# Patient Record
Sex: Male | Born: 1955
Health system: Southern US, Community
[De-identification: ages and names within clinical notes are randomized; demographics above are authoritative.]

## PROBLEM LIST (undated history)

## (undated) DIAGNOSIS — F329 Major depressive disorder, single episode, unspecified: Secondary | ICD-10-CM

## (undated) DIAGNOSIS — B0052 Herpesviral keratitis: Secondary | ICD-10-CM

## (undated) DIAGNOSIS — I5030 Unspecified diastolic (congestive) heart failure: Secondary | ICD-10-CM

## (undated) DIAGNOSIS — I10 Essential (primary) hypertension: Secondary | ICD-10-CM

## (undated) DIAGNOSIS — B20 Human immunodeficiency virus [HIV] disease: Secondary | ICD-10-CM

## (undated) DIAGNOSIS — Z21 Asymptomatic human immunodeficiency virus [HIV] infection status: Secondary | ICD-10-CM

## (undated) DIAGNOSIS — D893 Immune reconstitution syndrome: Secondary | ICD-10-CM

## (undated) DIAGNOSIS — F32A Depression, unspecified: Secondary | ICD-10-CM

## (undated) HISTORY — DX: Herpesviral keratitis: B00.52

## (undated) HISTORY — DX: Immune reconstitution syndrome: D89.3

## (undated) HISTORY — DX: Asymptomatic human immunodeficiency virus (hiv) infection status: Z21

## (undated) HISTORY — DX: Major depressive disorder, single episode, unspecified: F32.9

## (undated) HISTORY — DX: Depression, unspecified: F32.A

## (undated) HISTORY — DX: Unspecified diastolic (congestive) heart failure: I50.30

## (undated) HISTORY — DX: Essential (primary) hypertension: I10

## (undated) HISTORY — DX: Human immunodeficiency virus (HIV) disease: B20

---

## 2001-09-15 ENCOUNTER — Inpatient Hospital Stay (HOSPITAL_COMMUNITY): Admission: EM | Admit: 2001-09-15 | Discharge: 2001-09-17 | Payer: Self-pay | Admitting: Emergency Medicine

## 2001-09-15 ENCOUNTER — Encounter: Payer: Self-pay | Admitting: Emergency Medicine

## 2001-09-22 ENCOUNTER — Encounter: Admission: RE | Admit: 2001-09-22 | Discharge: 2001-09-22 | Payer: Self-pay | Admitting: Internal Medicine

## 2001-10-20 ENCOUNTER — Ambulatory Visit (HOSPITAL_COMMUNITY): Admission: RE | Admit: 2001-10-20 | Discharge: 2001-10-20 | Payer: Self-pay | Admitting: Internal Medicine

## 2001-10-20 ENCOUNTER — Encounter: Admission: RE | Admit: 2001-10-20 | Discharge: 2001-10-20 | Payer: Self-pay | Admitting: Internal Medicine

## 2001-10-20 ENCOUNTER — Encounter (INDEPENDENT_AMBULATORY_CARE_PROVIDER_SITE_OTHER): Payer: Self-pay | Admitting: *Deleted

## 2001-10-20 LAB — CONVERTED CEMR LAB: CD4 Count: 9 microliters

## 2001-11-04 ENCOUNTER — Ambulatory Visit (HOSPITAL_COMMUNITY): Admission: RE | Admit: 2001-11-04 | Discharge: 2001-11-04 | Payer: Self-pay | Admitting: Infectious Diseases

## 2001-11-04 ENCOUNTER — Encounter: Admission: RE | Admit: 2001-11-04 | Discharge: 2001-11-04 | Payer: Self-pay | Admitting: Infectious Diseases

## 2001-11-24 ENCOUNTER — Encounter: Admission: RE | Admit: 2001-11-24 | Discharge: 2001-11-24 | Payer: Self-pay | Admitting: Infectious Diseases

## 2002-01-05 ENCOUNTER — Ambulatory Visit (HOSPITAL_COMMUNITY): Admission: RE | Admit: 2002-01-05 | Discharge: 2002-01-05 | Payer: Self-pay | Admitting: Infectious Diseases

## 2002-01-05 ENCOUNTER — Encounter: Admission: RE | Admit: 2002-01-05 | Discharge: 2002-01-05 | Payer: Self-pay | Admitting: Infectious Diseases

## 2002-02-09 ENCOUNTER — Encounter: Admission: RE | Admit: 2002-02-09 | Discharge: 2002-02-09 | Payer: Self-pay | Admitting: Infectious Diseases

## 2002-05-04 ENCOUNTER — Encounter: Admission: RE | Admit: 2002-05-04 | Discharge: 2002-05-04 | Payer: Self-pay | Admitting: Infectious Diseases

## 2002-10-05 ENCOUNTER — Ambulatory Visit (HOSPITAL_COMMUNITY): Admission: RE | Admit: 2002-10-05 | Discharge: 2002-10-05 | Payer: Self-pay | Admitting: Infectious Diseases

## 2002-10-05 ENCOUNTER — Encounter: Payer: Self-pay | Admitting: Infectious Diseases

## 2002-10-05 ENCOUNTER — Encounter: Admission: RE | Admit: 2002-10-05 | Discharge: 2002-10-05 | Payer: Self-pay | Admitting: Infectious Diseases

## 2003-01-23 ENCOUNTER — Encounter: Admission: RE | Admit: 2003-01-23 | Discharge: 2003-01-23 | Payer: Self-pay | Admitting: Infectious Diseases

## 2003-01-23 ENCOUNTER — Ambulatory Visit (HOSPITAL_COMMUNITY): Admission: RE | Admit: 2003-01-23 | Discharge: 2003-01-23 | Payer: Self-pay | Admitting: Infectious Diseases

## 2003-01-23 ENCOUNTER — Encounter: Payer: Self-pay | Admitting: Infectious Diseases

## 2003-07-19 ENCOUNTER — Encounter: Admission: RE | Admit: 2003-07-19 | Discharge: 2003-07-19 | Payer: Self-pay | Admitting: Infectious Diseases

## 2003-07-19 ENCOUNTER — Ambulatory Visit (HOSPITAL_COMMUNITY): Admission: RE | Admit: 2003-07-19 | Discharge: 2003-07-19 | Payer: Self-pay | Admitting: Infectious Diseases

## 2003-08-02 ENCOUNTER — Encounter: Admission: RE | Admit: 2003-08-02 | Discharge: 2003-08-02 | Payer: Self-pay | Admitting: Infectious Diseases

## 2003-09-06 ENCOUNTER — Encounter: Admission: RE | Admit: 2003-09-06 | Discharge: 2003-09-06 | Payer: Self-pay | Admitting: Internal Medicine

## 2003-11-13 ENCOUNTER — Encounter: Admission: RE | Admit: 2003-11-13 | Discharge: 2003-11-13 | Payer: Self-pay | Admitting: Infectious Diseases

## 2003-11-13 ENCOUNTER — Ambulatory Visit (HOSPITAL_COMMUNITY): Admission: RE | Admit: 2003-11-13 | Discharge: 2003-11-13 | Payer: Self-pay | Admitting: Infectious Diseases

## 2003-12-25 ENCOUNTER — Ambulatory Visit: Payer: Self-pay | Admitting: Infectious Diseases

## 2004-05-01 ENCOUNTER — Ambulatory Visit (HOSPITAL_COMMUNITY): Admission: RE | Admit: 2004-05-01 | Discharge: 2004-05-01 | Payer: Self-pay | Admitting: Infectious Diseases

## 2004-05-01 ENCOUNTER — Ambulatory Visit: Payer: Self-pay | Admitting: Infectious Diseases

## 2004-07-03 ENCOUNTER — Ambulatory Visit (HOSPITAL_COMMUNITY): Admission: RE | Admit: 2004-07-03 | Discharge: 2004-07-03 | Payer: Self-pay | Admitting: Infectious Diseases

## 2004-07-03 ENCOUNTER — Ambulatory Visit: Payer: Self-pay | Admitting: Infectious Diseases

## 2004-10-26 ENCOUNTER — Emergency Department (HOSPITAL_COMMUNITY): Admission: EM | Admit: 2004-10-26 | Discharge: 2004-10-26 | Payer: Self-pay | Admitting: Emergency Medicine

## 2004-11-01 ENCOUNTER — Ambulatory Visit: Payer: Self-pay | Admitting: Infectious Diseases

## 2004-11-20 ENCOUNTER — Ambulatory Visit: Payer: Self-pay | Admitting: Infectious Diseases

## 2004-12-06 ENCOUNTER — Ambulatory Visit: Payer: Self-pay | Admitting: Infectious Diseases

## 2005-01-17 ENCOUNTER — Ambulatory Visit (HOSPITAL_COMMUNITY): Admission: RE | Admit: 2005-01-17 | Discharge: 2005-01-17 | Payer: Self-pay | Admitting: Infectious Diseases

## 2005-01-17 ENCOUNTER — Ambulatory Visit: Payer: Self-pay | Admitting: Infectious Diseases

## 2005-03-26 ENCOUNTER — Ambulatory Visit: Payer: Self-pay | Admitting: Infectious Diseases

## 2005-03-26 ENCOUNTER — Encounter (INDEPENDENT_AMBULATORY_CARE_PROVIDER_SITE_OTHER): Payer: Self-pay | Admitting: *Deleted

## 2005-03-26 ENCOUNTER — Ambulatory Visit (HOSPITAL_COMMUNITY): Admission: RE | Admit: 2005-03-26 | Discharge: 2005-03-26 | Payer: Self-pay | Admitting: Infectious Diseases

## 2005-03-26 LAB — CONVERTED CEMR LAB: HIV 1 RNA Quant: 399 copies/mL

## 2005-08-20 ENCOUNTER — Ambulatory Visit: Payer: Self-pay | Admitting: Infectious Diseases

## 2005-08-20 ENCOUNTER — Encounter (INDEPENDENT_AMBULATORY_CARE_PROVIDER_SITE_OTHER): Payer: Self-pay | Admitting: *Deleted

## 2005-08-20 ENCOUNTER — Encounter: Admission: RE | Admit: 2005-08-20 | Discharge: 2005-08-20 | Payer: Self-pay | Admitting: Infectious Diseases

## 2005-08-20 LAB — CONVERTED CEMR LAB
CD4 Count: 60 microliters
HIV 1 RNA Quant: 1000000 copies/mL

## 2005-10-01 ENCOUNTER — Ambulatory Visit: Payer: Self-pay | Admitting: Internal Medicine

## 2005-12-05 ENCOUNTER — Ambulatory Visit: Payer: Self-pay | Admitting: Internal Medicine

## 2005-12-22 ENCOUNTER — Encounter: Admission: RE | Admit: 2005-12-22 | Discharge: 2005-12-22 | Payer: Self-pay | Admitting: Infectious Diseases

## 2005-12-22 ENCOUNTER — Ambulatory Visit: Payer: Self-pay | Admitting: Infectious Diseases

## 2005-12-22 ENCOUNTER — Encounter (INDEPENDENT_AMBULATORY_CARE_PROVIDER_SITE_OTHER): Payer: Self-pay | Admitting: *Deleted

## 2005-12-22 LAB — CONVERTED CEMR LAB: HIV 1 RNA Quant: 119000 copies/mL — ABNORMAL HIGH (ref <50)

## 2005-12-24 DIAGNOSIS — F528 Other sexual dysfunction not due to a substance or known physiological condition: Secondary | ICD-10-CM

## 2005-12-24 DIAGNOSIS — H05019 Cellulitis of unspecified orbit: Secondary | ICD-10-CM

## 2005-12-24 DIAGNOSIS — L0293 Carbuncle, unspecified: Secondary | ICD-10-CM

## 2005-12-24 DIAGNOSIS — B20 Human immunodeficiency virus [HIV] disease: Secondary | ICD-10-CM

## 2005-12-24 DIAGNOSIS — G4700 Insomnia, unspecified: Secondary | ICD-10-CM

## 2005-12-24 DIAGNOSIS — L0292 Furuncle, unspecified: Secondary | ICD-10-CM | POA: Insufficient documentation

## 2005-12-24 DIAGNOSIS — L0390 Cellulitis, unspecified: Secondary | ICD-10-CM

## 2005-12-24 DIAGNOSIS — L0291 Cutaneous abscess, unspecified: Secondary | ICD-10-CM

## 2005-12-24 DIAGNOSIS — B59 Pneumocystosis: Secondary | ICD-10-CM | POA: Insufficient documentation

## 2006-05-06 ENCOUNTER — Encounter: Payer: Self-pay | Admitting: Infectious Diseases

## 2006-05-06 ENCOUNTER — Encounter: Admission: RE | Admit: 2006-05-06 | Discharge: 2006-05-06 | Payer: Self-pay | Admitting: Infectious Diseases

## 2006-05-06 ENCOUNTER — Ambulatory Visit: Payer: Self-pay | Admitting: Infectious Diseases

## 2006-05-06 DIAGNOSIS — M129 Arthropathy, unspecified: Secondary | ICD-10-CM | POA: Insufficient documentation

## 2006-05-06 LAB — CONVERTED CEMR LAB
ALT: 16 units/L (ref 0–53)
Alkaline Phosphatase: 93 units/L (ref 39–117)
CO2: 22 meq/L (ref 19–32)
Cholesterol: 226 mg/dL — ABNORMAL HIGH (ref 0–200)
Creatinine, Ser: 1.04 mg/dL (ref 0.40–1.50)
HCT: 46.3 % (ref 39.0–52.0)
HIV 1 RNA Quant: 105 copies/mL — ABNORMAL HIGH (ref <50)
LDL Cholesterol: 137 mg/dL — ABNORMAL HIGH (ref 0–99)
MCHC: 32.2 g/dL (ref 30.0–36.0)
MCV: 93.3 fL (ref 78.0–100.0)
Platelets: 302 10*3/uL (ref 150–400)
Sodium: 137 meq/L (ref 135–145)
Total Bilirubin: 0.5 mg/dL (ref 0.3–1.2)
Total CHOL/HDL Ratio: 3.5
Triglycerides: 121 mg/dL (ref <150)
VLDL: 24 mg/dL (ref 0–40)
WBC: 4.3 10*3/uL (ref 4.0–10.5)

## 2006-05-11 ENCOUNTER — Encounter (INDEPENDENT_AMBULATORY_CARE_PROVIDER_SITE_OTHER): Payer: Self-pay | Admitting: *Deleted

## 2006-05-11 LAB — CONVERTED CEMR LAB

## 2006-05-15 ENCOUNTER — Encounter: Payer: Self-pay | Admitting: Infectious Diseases

## 2006-05-24 ENCOUNTER — Encounter (INDEPENDENT_AMBULATORY_CARE_PROVIDER_SITE_OTHER): Payer: Self-pay | Admitting: *Deleted

## 2006-06-08 ENCOUNTER — Telehealth: Payer: Self-pay | Admitting: Infectious Diseases

## 2006-07-24 ENCOUNTER — Telehealth: Payer: Self-pay | Admitting: Infectious Diseases

## 2007-12-14 ENCOUNTER — Telehealth (INDEPENDENT_AMBULATORY_CARE_PROVIDER_SITE_OTHER): Payer: Self-pay | Admitting: *Deleted

## 2007-12-15 ENCOUNTER — Ambulatory Visit: Payer: Self-pay | Admitting: Internal Medicine

## 2007-12-15 ENCOUNTER — Ambulatory Visit (HOSPITAL_COMMUNITY): Admission: RE | Admit: 2007-12-15 | Discharge: 2007-12-15 | Payer: Self-pay | Admitting: Internal Medicine

## 2007-12-15 DIAGNOSIS — J8410 Pulmonary fibrosis, unspecified: Secondary | ICD-10-CM | POA: Insufficient documentation

## 2007-12-15 LAB — CONVERTED CEMR LAB
AST: 30 units/L (ref 0–37)
Alkaline Phosphatase: 87 units/L (ref 39–117)
BUN: 10 mg/dL (ref 6–23)
Basophils Relative: 0 % (ref 0–1)
Calcium: 8.4 mg/dL (ref 8.4–10.5)
Chloride: 106 meq/L (ref 96–112)
Creatinine, Ser: 1.13 mg/dL (ref 0.40–1.50)
Eosinophils Absolute: 0.5 10*3/uL (ref 0.0–0.7)
Eosinophils Relative: 14 % — ABNORMAL HIGH (ref 0–5)
Hemoglobin: 10 g/dL — ABNORMAL LOW (ref 13.0–17.0)
MCHC: 31.1 g/dL (ref 30.0–36.0)
MCV: 76.5 fL — ABNORMAL LOW (ref 78.0–100.0)
Monocytes Absolute: 0.3 10*3/uL (ref 0.1–1.0)
Monocytes Relative: 7 % (ref 3–12)
RBC: 4.21 M/uL — ABNORMAL LOW (ref 4.22–5.81)
Total Bilirubin: 0.3 mg/dL (ref 0.3–1.2)

## 2007-12-20 ENCOUNTER — Telehealth: Payer: Self-pay

## 2008-01-18 ENCOUNTER — Ambulatory Visit: Payer: Self-pay | Admitting: Infectious Diseases

## 2008-01-18 LAB — CONVERTED CEMR LAB: Hep A Total Ab: NEGATIVE

## 2008-02-23 ENCOUNTER — Emergency Department (HOSPITAL_COMMUNITY): Admission: EM | Admit: 2008-02-23 | Discharge: 2008-02-23 | Payer: Self-pay | Admitting: Emergency Medicine

## 2008-03-22 ENCOUNTER — Ambulatory Visit: Payer: Self-pay | Admitting: Infectious Diseases

## 2008-03-22 LAB — CONVERTED CEMR LAB: HIV 1 RNA Quant: 76200 copies/mL — ABNORMAL HIGH (ref <48)

## 2008-03-24 LAB — CONVERTED CEMR LAB
Albumin: 4.2 g/dL (ref 3.5–5.2)
CO2: 21 meq/L (ref 19–32)
Calcium: 8.9 mg/dL (ref 8.4–10.5)
Eosinophils Relative: 23 % — ABNORMAL HIGH (ref 0–5)
Glucose, Bld: 86 mg/dL (ref 70–99)
HCT: 31.3 % — ABNORMAL LOW (ref 39.0–52.0)
Lymphocytes Relative: 18 % (ref 12–46)
Lymphs Abs: 1 10*3/uL (ref 0.7–4.0)
Platelets: 304 10*3/uL (ref 150–400)
Potassium: 4.3 meq/L (ref 3.5–5.3)
Sodium: 136 meq/L (ref 135–145)
Total Bilirubin: 0.4 mg/dL (ref 0.3–1.2)
Total Protein: 8.4 g/dL — ABNORMAL HIGH (ref 6.0–8.3)
WBC: 5.4 10*3/uL (ref 4.0–10.5)

## 2008-04-25 ENCOUNTER — Ambulatory Visit: Payer: Self-pay | Admitting: Gastroenterology

## 2008-04-25 DIAGNOSIS — D509 Iron deficiency anemia, unspecified: Secondary | ICD-10-CM | POA: Insufficient documentation

## 2008-04-25 DIAGNOSIS — R12 Heartburn: Secondary | ICD-10-CM | POA: Insufficient documentation

## 2008-05-05 ENCOUNTER — Telehealth: Payer: Self-pay | Admitting: Gastroenterology

## 2008-05-15 ENCOUNTER — Telehealth: Payer: Self-pay | Admitting: Gastroenterology

## 2008-05-22 ENCOUNTER — Ambulatory Visit: Payer: Self-pay | Admitting: Gastroenterology

## 2008-10-17 ENCOUNTER — Telehealth: Payer: Self-pay | Admitting: Gastroenterology

## 2008-11-23 ENCOUNTER — Encounter: Payer: Self-pay | Admitting: Gastroenterology

## 2008-11-23 DIAGNOSIS — A4902 Methicillin resistant Staphylococcus aureus infection, unspecified site: Secondary | ICD-10-CM | POA: Insufficient documentation

## 2008-11-28 ENCOUNTER — Telehealth: Payer: Self-pay | Admitting: Gastroenterology

## 2008-11-28 ENCOUNTER — Ambulatory Visit: Payer: Self-pay | Admitting: Gastroenterology

## 2008-11-29 ENCOUNTER — Ambulatory Visit: Payer: Self-pay | Admitting: Infectious Diseases

## 2008-11-29 LAB — CONVERTED CEMR LAB
ALT: 13 units/L (ref 0–53)
Alkaline Phosphatase: 85 units/L (ref 39–117)
Basophils Absolute: 0 10*3/uL (ref 0.0–0.1)
Basophils Relative: 0 % (ref 0–1)
Creatinine, Ser: 0.89 mg/dL (ref 0.40–1.50)
Glucose, Bld: 93 mg/dL (ref 70–99)
HIV 1 RNA Quant: 57700 copies/mL — ABNORMAL HIGH (ref <48)
MCHC: 28.7 g/dL — ABNORMAL LOW (ref 30.0–36.0)
Neutro Abs: 3 10*3/uL (ref 1.7–7.7)
Neutrophils Relative %: 60 % (ref 43–77)
Platelets: 369 10*3/uL (ref 150–400)
RDW: 19.8 % — ABNORMAL HIGH (ref 11.5–15.5)
Sodium: 137 meq/L (ref 135–145)
Total Bilirubin: 0.4 mg/dL (ref 0.3–1.2)
Total Protein: 8.5 g/dL — ABNORMAL HIGH (ref 6.0–8.3)

## 2008-12-07 ENCOUNTER — Telehealth (INDEPENDENT_AMBULATORY_CARE_PROVIDER_SITE_OTHER): Payer: Self-pay | Admitting: *Deleted

## 2009-01-08 ENCOUNTER — Inpatient Hospital Stay (HOSPITAL_COMMUNITY): Admission: EM | Admit: 2009-01-08 | Discharge: 2009-01-12 | Payer: Self-pay | Admitting: Emergency Medicine

## 2009-01-08 ENCOUNTER — Ambulatory Visit: Payer: Self-pay | Admitting: Infectious Diseases

## 2009-01-12 ENCOUNTER — Encounter: Payer: Self-pay | Admitting: Infectious Diseases

## 2009-01-12 ENCOUNTER — Encounter: Payer: Self-pay | Admitting: Internal Medicine

## 2009-01-24 ENCOUNTER — Ambulatory Visit: Payer: Self-pay | Admitting: Infectious Diseases

## 2009-01-24 DIAGNOSIS — K1370 Unspecified lesions of oral mucosa: Secondary | ICD-10-CM | POA: Insufficient documentation

## 2009-01-24 LAB — CONVERTED CEMR LAB
Basophils Relative: 1 % (ref 0–1)
HIV 1 RNA Quant: 249000 copies/mL — ABNORMAL HIGH (ref <48)
HIV-1 RNA Quant, Log: 5.4 — ABNORMAL HIGH (ref <1.68)
Hemoglobin: 8.4 g/dL — ABNORMAL LOW (ref 13.0–17.0)
Lymphs Abs: 0.7 10*3/uL (ref 0.7–4.0)
MCHC: 27.6 g/dL — ABNORMAL LOW (ref 30.0–36.0)
Monocytes Absolute: 0.2 10*3/uL (ref 0.1–1.0)
Monocytes Relative: 6 % (ref 3–12)
Neutro Abs: 1.8 10*3/uL (ref 1.7–7.7)
RBC: 4.59 M/uL (ref 4.22–5.81)
Retic Ct Pct: 1.1 % (ref 0.4–3.1)
WBC: 3.1 10*3/uL — ABNORMAL LOW (ref 4.0–10.5)

## 2009-01-31 ENCOUNTER — Encounter (INDEPENDENT_AMBULATORY_CARE_PROVIDER_SITE_OTHER): Payer: Self-pay | Admitting: *Deleted

## 2009-02-16 ENCOUNTER — Telehealth: Payer: Self-pay | Admitting: Infectious Diseases

## 2009-02-19 ENCOUNTER — Telehealth (INDEPENDENT_AMBULATORY_CARE_PROVIDER_SITE_OTHER): Payer: Self-pay | Admitting: *Deleted

## 2009-02-21 ENCOUNTER — Ambulatory Visit: Payer: Self-pay | Admitting: Infectious Diseases

## 2009-02-21 DIAGNOSIS — A310 Pulmonary mycobacterial infection: Secondary | ICD-10-CM | POA: Insufficient documentation

## 2009-02-22 ENCOUNTER — Telehealth (INDEPENDENT_AMBULATORY_CARE_PROVIDER_SITE_OTHER): Payer: Self-pay | Admitting: *Deleted

## 2009-03-21 ENCOUNTER — Telehealth (INDEPENDENT_AMBULATORY_CARE_PROVIDER_SITE_OTHER): Payer: Self-pay | Admitting: *Deleted

## 2009-04-11 ENCOUNTER — Ambulatory Visit: Payer: Self-pay | Admitting: Infectious Diseases

## 2009-04-11 DIAGNOSIS — H109 Unspecified conjunctivitis: Secondary | ICD-10-CM | POA: Insufficient documentation

## 2009-04-11 LAB — CONVERTED CEMR LAB
ALT: 16 units/L (ref 0–53)
AST: 17 units/L (ref 0–37)
CO2: 20 meq/L (ref 19–32)
Calcium: 9.6 mg/dL (ref 8.4–10.5)
Chloride: 106 meq/L (ref 96–112)
Cholesterol: 158 mg/dL (ref 0–200)
HCT: 34.2 % — ABNORMAL LOW (ref 39.0–52.0)
HIV 1 RNA Quant: 1220 copies/mL — ABNORMAL HIGH (ref <48)
HIV-1 RNA Quant, Log: 3.09 — ABNORMAL HIGH (ref <1.68)
Lymphocytes Relative: 32 % (ref 12–46)
Lymphs Abs: 1.5 10*3/uL (ref 0.7–4.0)
Monocytes Relative: 10 % (ref 3–12)
Neutro Abs: 2.2 10*3/uL (ref 1.7–7.7)
Neutrophils Relative %: 45 % (ref 43–77)
Platelets: 412 10*3/uL — ABNORMAL HIGH (ref 150–400)
Potassium: 4.2 meq/L (ref 3.5–5.3)
Sodium: 136 meq/L (ref 135–145)
Total CHOL/HDL Ratio: 3.1
Total Protein: 8.1 g/dL (ref 6.0–8.3)
WBC: 4.9 10*3/uL (ref 4.0–10.5)

## 2009-04-17 ENCOUNTER — Telehealth (INDEPENDENT_AMBULATORY_CARE_PROVIDER_SITE_OTHER): Payer: Self-pay | Admitting: *Deleted

## 2009-05-15 ENCOUNTER — Encounter (INDEPENDENT_AMBULATORY_CARE_PROVIDER_SITE_OTHER): Payer: Self-pay | Admitting: *Deleted

## 2009-05-17 ENCOUNTER — Telehealth (INDEPENDENT_AMBULATORY_CARE_PROVIDER_SITE_OTHER): Payer: Self-pay | Admitting: *Deleted

## 2009-05-30 ENCOUNTER — Telehealth (INDEPENDENT_AMBULATORY_CARE_PROVIDER_SITE_OTHER): Payer: Self-pay | Admitting: *Deleted

## 2009-05-31 ENCOUNTER — Encounter: Payer: Self-pay | Admitting: Infectious Disease

## 2009-05-31 ENCOUNTER — Ambulatory Visit: Payer: Self-pay | Admitting: Vascular Surgery

## 2009-05-31 ENCOUNTER — Ambulatory Visit: Payer: Self-pay | Admitting: Infectious Disease

## 2009-05-31 ENCOUNTER — Ambulatory Visit (HOSPITAL_COMMUNITY): Admission: RE | Admit: 2009-05-31 | Discharge: 2009-05-31 | Payer: Self-pay | Admitting: Infectious Disease

## 2009-05-31 DIAGNOSIS — R609 Edema, unspecified: Secondary | ICD-10-CM | POA: Insufficient documentation

## 2009-05-31 DIAGNOSIS — R0609 Other forms of dyspnea: Secondary | ICD-10-CM | POA: Insufficient documentation

## 2009-05-31 DIAGNOSIS — R0989 Other specified symptoms and signs involving the circulatory and respiratory systems: Secondary | ICD-10-CM

## 2009-05-31 LAB — CONVERTED CEMR LAB
ALT: 19 units/L (ref 0–53)
AST: 21 units/L (ref 0–37)
Albumin: 4.1 g/dL (ref 3.5–5.2)
Alkaline Phosphatase: 81 units/L (ref 39–117)
Basophils Absolute: 0.1 10*3/uL (ref 0.0–0.1)
Basophils Relative: 1 % (ref 0–1)
Calcium: 9.9 mg/dL (ref 8.4–10.5)
Chloride: 105 meq/L (ref 96–112)
Hemoglobin: 13.9 g/dL (ref 13.0–17.0)
Lymphocytes Relative: 23 % (ref 12–46)
MCHC: 32.3 g/dL (ref 30.0–36.0)
Monocytes Absolute: 0.5 10*3/uL (ref 0.1–1.0)
Neutro Abs: 2.7 10*3/uL (ref 1.7–7.7)
Neutrophils Relative %: 55 % (ref 43–77)
Potassium: 4.6 meq/L (ref 3.5–5.3)
RBC: 4.51 M/uL (ref 4.22–5.81)
RDW: 19.5 % — ABNORMAL HIGH (ref 11.5–15.5)
Sodium: 138 meq/L (ref 135–145)
Total Protein: 8.5 g/dL — ABNORMAL HIGH (ref 6.0–8.3)

## 2009-06-19 ENCOUNTER — Telehealth (INDEPENDENT_AMBULATORY_CARE_PROVIDER_SITE_OTHER): Payer: Self-pay | Admitting: *Deleted

## 2009-07-11 ENCOUNTER — Telehealth (INDEPENDENT_AMBULATORY_CARE_PROVIDER_SITE_OTHER): Payer: Self-pay | Admitting: *Deleted

## 2009-07-11 ENCOUNTER — Ambulatory Visit: Payer: Self-pay | Admitting: Infectious Diseases

## 2009-08-17 ENCOUNTER — Telehealth (INDEPENDENT_AMBULATORY_CARE_PROVIDER_SITE_OTHER): Payer: Self-pay | Admitting: *Deleted

## 2009-10-26 ENCOUNTER — Telehealth (INDEPENDENT_AMBULATORY_CARE_PROVIDER_SITE_OTHER): Payer: Self-pay | Admitting: *Deleted

## 2010-01-14 ENCOUNTER — Ambulatory Visit: Payer: Self-pay | Admitting: Infectious Diseases

## 2010-01-14 LAB — CONVERTED CEMR LAB
AST: 21 units/L (ref 0–37)
Albumin: 4.2 g/dL (ref 3.5–5.2)
BUN: 8 mg/dL (ref 6–23)
Basophils Absolute: 0 10*3/uL (ref 0.0–0.1)
CO2: 26 meq/L (ref 19–32)
Calcium: 9.5 mg/dL (ref 8.4–10.5)
Chloride: 107 meq/L (ref 96–112)
Creatinine, Ser: 0.9 mg/dL (ref 0.40–1.50)
Glucose, Bld: 102 mg/dL — ABNORMAL HIGH (ref 70–99)
Hemoglobin: 14.7 g/dL (ref 13.0–17.0)
Lymphocytes Relative: 30 % (ref 12–46)
Lymphs Abs: 1.2 10*3/uL (ref 0.7–4.0)
Monocytes Absolute: 0.4 10*3/uL (ref 0.1–1.0)
Neutro Abs: 2 10*3/uL (ref 1.7–7.7)
Potassium: 4.2 meq/L (ref 3.5–5.3)
RBC: 5.01 M/uL (ref 4.22–5.81)
RDW: 20.1 % — ABNORMAL HIGH (ref 11.5–15.5)
WBC: 3.9 10*3/uL — ABNORMAL LOW (ref 4.0–10.5)

## 2010-02-04 ENCOUNTER — Encounter (INDEPENDENT_AMBULATORY_CARE_PROVIDER_SITE_OTHER): Payer: Self-pay | Admitting: *Deleted

## 2010-04-18 NOTE — Assessment & Plan Note (Signed)
Summary: resfrom 1/10/jh vac/kam   Referring Provider:  n/a Primary Provider:  [name], MD  CC:  F/U .  History of Present Illness: [name] with HV+. 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 He responded to treatment with bactrim and steroids. CD4 < 10, VL 44,000. AFB smears/Cx's negative to date. Parvo not done.  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 He was called at home since last visit and started on ETH/Azithro.  c/o eye infection, conjunctivitis. has been seen by his ophtho. vision is ok.  taking ART well. got laid off, has been gaining weight. has gotten into a play and is encouraged about this.   Preventive Screening-Counseling & Management  Alcohol-Tobacco     Alcohol drinks/day: occassionally     Smoking Status: quit     Smoking Cessation Counseling: yes     Year Quit: 2007  Caffeine-Diet-Exercise     Caffeine use/day: tea     Does Patient Exercise: yes     Type of exercise: walking     Times/week: 7   Updated Prior Medication List: VIAGRA 25 MG TABS (SILDENAFIL CITRATE) as needed BACTRIM DS 800-160 MG TABS (SULFAMETHOXAZOLE-TRIMETHOPRIM) Take 1 tablet by mouth once a day DIFLUCAN 100 MG TABS (FLUCONAZOLE) take 2 pills today and then one pill a day for 9 more days COMBIVIR 150-300 MG TABS (LAMIVUDINE-ZIDOVUDINE) Take 1 tablet by mouth two times a day IRON 325 (65 FE) MG TABS (FERROUS SULFATE) Take 1 tablet by mouth three times a day AZITHROMYCIN 500 MG TABS (AZITHROMYCIN) take one daily for 6 weeks ETHAMBUTOL HCL 400 MG TABS (ETHAMBUTOL HCL) take three tablets daily X 6 weeks INTELENCE 100 MG TABS (ETRAVIRINE) 2 tab by mouth two times a day ISENTRESS 400 MG TABS (RALTEGRAVIR POTASSIUM) Take 1 tablet by mouth two times a day  Current Allergies (reviewed today): ! * TENOFOVIR Review of Systems       wt up 15#  Vital Signs:  Patient profile:   55 year old male Height:      69 inches (175.26 cm) Weight:       193 pounds (87.73 kg) BMI:     28.60 Temp:     98.2 degrees F (36.78 degrees C) oral Pulse rate:   88 / minute BP sitting:   131 / 74  (left arm)  Vitals Entered By: Starleen Arms CMA (April 11, 2009 3:25 PM) CC: F/U  Is Patient Diabetic? No Pain Assessment Patient in pain? no      Nutritional Status BMI of 25 - 29 = overweight Nutritional Status Detail NL  Have you ever been in a relationship where you felt threatened, hurt or afraid?No   Does patient need assistance? Functional Status Self care Ambulation Normal   Physical Exam  General:  well-developed, well-nourished, and well-hydrated.   Eyes:  pupils equal, pupils round, pupils reactive to light, and conjunctival injection.  crusted d/c.  Mouth:  pharynx pink and moist and no exudates.   Neck:  no masses.   Lungs:  normal respiratory effort and normal breath sounds.   Heart:  normal rate, regular rhythm, and no murmur.   Abdomen:  soft, non-tender, and normal bowel sounds.          Medication Adherence: 04/11/2009   Adherence to medications reviewed with patient. Counseling to provide adequate adherence provided   Prevention For Positives: 04/11/2009   Safe sex practices discussed with patient. Condoms offered.  Impression & Recommendations:  Problem # 1:  HIV DISEASE (ICD-042) will recheck his labs since he is on new regimen. he is not sexually active. has gotten flu shot.  His updated medication list for this problem includes:    Bactrim Ds 800-160 Mg Tabs (Sulfamethoxazole-trimethoprim) .Marland Kitchen... Take 1 tablet by mouth once a day    Diflucan 100 Mg Tabs (Fluconazole) .Marland Kitchen... Take 2 pills today and then one pill a day for 9 more days    Azithromycin 500 Mg Tabs (Azithromycin) .Marland Kitchen... Take one daily for 6 weeks    Ethambutol Hcl 400 Mg Tabs (Ethambutol hcl) .Marland Kitchen... Take three tablets daily x 6 weeks  Problem # 2:  CONJUNCTIVITIS (ICD-372.30) has f/u with ophtho on 1-28. will  cont his current rx's.   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 has had recent ophtho eval. will continue til CD4 improves.   Other Orders: Est. Patient Level IV [phone]) T-CD4SP ([facility]) (CD4SP) T-HIV Viral Load [phone]) T-Comprehensive Metabolic Panel [phone]) T-Lipid Profile [phone]) T-CBC w/Diff [phone]) T-RPR (Syphilis) [phone])  Process Orders Check Orders Results:     [facility]: Order checked:     22930 -- T-Lipid Profile -- ABN required due to diagnosis (CPT: 80061) Tests Sent for requisitioning (April 11, 2009 3:54 PM):     04/11/2009: [facility] -- T-HIV Viral Load [phone] (signed)     04/11/2009: [facility] -- T-Comprehensive Metabolic Panel [[id number]] (signed)     04/11/2009: [facility] -- T-Lipid Profile [phone] (signed)     04/11/2009: [facility] -- T-CBC w/Diff [[id number]] (signed)     04/11/2009: [facility] -- T-RPR (Syphilis) [phone] (signed)         Medication Adherence: 04/11/2009   Adherence to medications reviewed with patient. Counseling to provide adequate adherence provided    Prevention For Positives: 04/11/2009   Safe sex practices discussed with patient. Condoms offered.

## 2010-04-18 NOTE — Progress Notes (Signed)
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)  Phone Note Refill Request      Prescriptions: ISENTRESS 400 MG TABS (RALTEGRAVIR POTASSIUM) Take 1 tablet by mouth two times a day  #60 x 0   Entered by:   Paulo Fruit  BS,CPht II,MPH   Authorized by:   [name] MD   Signed by:   Paulo Fruit  BS,CPht II,MPH on 08/17/2009   Method used:   Samples Given   RxID:   [id number] INTELENCE 100 MG TABS (ETRAVIRINE) 2 tab by mouth two times a day  #120 x 0   Entered by:   Paulo Fruit  BS,CPht II,MPH   Authorized by:   [name] MD   Signed by:   Paulo Fruit  BS,CPht II,MPH on 08/17/2009   Method used:   Samples Given   RxID:   [id number] COMBIVIR 150-300 MG TABS (LAMIVUDINE-ZIDOVUDINE) Take 1 tablet by mouth two times a day  #60 x 0   Entered by:   Paulo Fruit  BS,CPht II,MPH   Authorized by:   [name] MD   Signed by:   Paulo Fruit  BS,CPht II,MPH on 08/17/2009   Method used:   Samples Given   RxID:   [id number] BACTRIM DS 800-160 MG TABS (SULFAMETHOXAZOLE-TRIMETHOPRIM) Take 1 tablet by mouth once a day  #30 x 0   Entered by:   Paulo Fruit  BS,CPht II,MPH   Authorized by:   [name] MD   Signed by:   Paulo Fruit  BS,CPht II,MPH on 08/17/2009   Method used:   Samples Given   RxID:   [id number]   Patient Assist Medication Verification: Medication: Isentress 400mg  Lot# [id number] Exp Date:07 2013 Tech approval:MLD               Patient Assist Medication Verification: Medication name:SMZ-TMP DS 800/160mg  RX # [id number] Tech approval:MLD   Patient Assist Medication Verification: Medication:combivir 150/300mg  Lot# [id number] Exp Date:Dec 2014 Tech approval:MLD                Patient Assist Medication Verification: Medication:Intelence 100mg  Lot# [id number] Exp Date:10 2012 Tech approval:MLD Call placed to patient with message that assistance medications are ready for pick-up. Left message on patient's voicemail  Paulo Fruit  BS,CPht II,MPH  Paul [name]  3, 2011 3:59 PM

## 2010-04-18 NOTE — Progress Notes (Signed)
Summary: Meds arrived for aug from [facility]  Phone Note Refill Request      Prescriptions: ISENTRESS 400 MG TABS (RALTEGRAVIR POTASSIUM) Take 1 tablet by mouth two times a day  #60 Not Speci x 0   Entered by:   Paulo Fruit  BS,CPht II,MPH   Authorized by:   [name] MD   Signed by:   Paulo Fruit  BS,CPht II,MPH on 10/26/2009   Method used:   Samples Given   RxID:   [id number] INTELENCE 100 MG TABS (ETRAVIRINE) 2 tab by mouth two times a day  #120 Not Spec x 0   Entered by:   Paulo Fruit  BS,CPht II,MPH   Authorized by:   [name] MD   Signed by:   Paulo Fruit  BS,CPht II,MPH on 10/26/2009   Method used:   Samples Given   RxID:   [id number] COMBIVIR 150-300 MG TABS (LAMIVUDINE-ZIDOVUDINE) Take 1 tablet by mouth two times a day  #60 Not Speci x 0   Entered by:   Paulo Fruit  BS,CPht II,MPH   Authorized by:   [name] MD   Signed by:   Paulo Fruit  BS,CPht II,MPH on 10/26/2009   Method used:   Samples Given   RxID:   [id number] BACTRIM DS 800-160 MG TABS (SULFAMETHOXAZOLE-TRIMETHOPRIM) Take 1 tablet by mouth once a day  #30 Not Speci x 0   Entered by:   Paulo Fruit  BS,CPht II,MPH   Authorized by:   [name] MD   Signed by:   Paulo Fruit  BS,CPht II,MPH on 10/26/2009   Method used:   Samples Given   RxID:   [id number]  Patient Assist Medication Verification: Medication name: SMZ-TMP DS 800/160mg  RX # [id number] Tech approval:MLD  Patient Assist Medication Verification: Medication name:Isentress 400mg  RX # [id number] Tech approval:MLD  Patient Assist Medication Verification: Medication name:combivir 150/300mg  RX # [id number] Tech approval:MLD  Patient Assist Medication Verification: Medication name:intelence 100mg  RX # [id number] Tech approval:MLD **Patient came to pick up medications today. Paulo Fruit  BS,CPht II,MPH  October 26, 2009 2:38 PM

## 2010-04-18 NOTE — Progress Notes (Signed)
Summary: [employer]/SPAP meds arrived for Feb  Phone Note Refill Request      Prescriptions: ISENTRESS 400 MG TABS (RALTEGRAVIR POTASSIUM) Take 1 tablet by mouth two times a day  #60 x 0   Entered by:   Paulo Fruit  BS,CPht II,MPH   Authorized by:   [name] MD   Signed by:   Paulo Fruit  BS,CPht II,MPH on 04/17/2009   Method used:   Samples Given   RxID:   [id number] INTELENCE 100 MG TABS (ETRAVIRINE) 2 tab by mouth two times a day  #120 x 0   Entered by:   Paulo Fruit  BS,CPht II,MPH   Authorized by:   [name] MD   Signed by:   Paulo Fruit  BS,CPht II,MPH on 04/17/2009   Method used:   Samples Given   RxID:   [id number] COMBIVIR 150-300 MG TABS (LAMIVUDINE-ZIDOVUDINE) Take 1 tablet by mouth two times a day  #60 x 0   Entered by:   Paulo Fruit  BS,CPht II,MPH   Authorized by:   [name] MD   Signed by:   Paulo Fruit  BS,CPht II,MPH on 04/17/2009   Method used:   Samples Given   RxID:   [id number] BACTRIM DS 800-160 MG TABS (SULFAMETHOXAZOLE-TRIMETHOPRIM) Take 1 tablet by mouth once a day  #30 x 0   Entered by:   Paulo Fruit  BS,CPht II,MPH   Authorized by:   [name] MD   Signed by:   Paulo Fruit  BS,CPht II,MPH on 04/17/2009   Method used:   Samples Given   RxID:   [id number]  Patient Assist Medication Verification: Medication name: SMZ-TMP DS 800/160mg  RX # [id number] Tech approval:MLD   Patient Assist Medication Verification: Medication: Intelence 100mg  Lot#[id number] Exp Date:07 2012 Tech approval:MLD                Patient Assist Medication Verification: Medication: Isentress 400mg  SAY#[id number] Exp Date:02 2013 Tech approval:MLD                Patient Assist Medication Verification: Medication:Combivir 150/300mg  XNA#[id number] Exp Date:Jul 2014 Tech approval:MLD Call placed to patient with message that assistance medications are ready for pick-up. Paulo Fruit  BS,CPht II,MPH  April 17, 2009 3:20 PM

## 2010-04-18 NOTE — Progress Notes (Signed)
Summary: [employer]ADAP/SPAP meds arrived for Apri  Phone Note Refill Request      Prescriptions: ISENTRESS 400 MG TABS (RALTEGRAVIR POTASSIUM) Take 1 tablet by mouth two times a day  #60 x 0   Entered by:   Paulo Fruit  BS,CPht II,MPH   Authorized by:   [name] MD   Signed by:   Paulo Fruit  BS,CPht II,MPH on 07/11/2009   Method used:   Samples Given   RxID:   [id number] INTELENCE 100 MG TABS (ETRAVIRINE) 2 tab by mouth two times a day  #120 x 0   Entered by:   Paulo Fruit  BS,CPht II,MPH   Authorized by:   [name] MD   Signed by:   Paulo Fruit  BS,CPht II,MPH on 07/11/2009   Method used:   Samples Given   RxID:   [id number] COMBIVIR 150-300 MG TABS (LAMIVUDINE-ZIDOVUDINE) Take 1 tablet by mouth two times a day  #60 x 0   Entered by:   Paulo Fruit  BS,CPht II,MPH   Authorized by:   [name] MD   Signed by:   Paulo Fruit  BS,CPht II,MPH on 07/11/2009   Method used:   Samples Given   RxID:   [id number] BACTRIM DS 800-160 MG TABS (SULFAMETHOXAZOLE-TRIMETHOPRIM) Take 1 tablet by mouth once a day  #30 x 0   Entered by:   Paulo Fruit  BS,CPht II,MPH   Authorized by:   [name] MD   Signed by:   Paulo Fruit  BS,CPht II,MPH on 07/11/2009   Method used:   Samples Given   RxID:   [id number]   Patient Assist Medication Verification: Medication: Isentress 400mg  QQV#[id number] Exp Date:03 2013 Tech approval:MLD               Patient Assist Medication Verification: Medication name:SMZ-TMP DS 800/160mg  RX # [id number] Tech approval:MLD    Patient Assist Medication Verification: Medication:Combivir 150mg /300mg  JOA#[id number] Exp Date:Sep 2014 Tech approval:MLD                Patient Assist Medication Verification: Medication:Intelence 100mg  Lot#[id number] Exp Date:11 2012 Tech approval:MLD **Patient just called about picking up his April medications. Paulo Fruit  BS,CPht II,MPH  July 11, 2009 9:35 AM

## 2010-04-18 NOTE — Progress Notes (Signed)
Summary: [employer]/pt assist meds arrived for Jan  Phone Note Refill Request      Prescriptions: ISENTRESS 400 MG TABS (RALTEGRAVIR POTASSIUM) Take 1 tablet by mouth two times a day  #60 x 0   Entered by:   Paulo Fruit  BS,CPht II,MPH   Authorized by:   [name] MD   Signed by:   Paulo Fruit  BS,CPht II,MPH on 03/21/2009   Method used:   Samples Given   RxID:   [id number] INTELENCE 100 MG TABS (ETRAVIRINE) 2 tab by mouth two times a day  #120 x 0   Entered by:   Paulo Fruit  BS,CPht II,MPH   Authorized by:   [name] MD   Signed by:   Paulo Fruit  BS,CPht II,MPH on 03/21/2009   Method used:   Samples Given   RxID:   [id number] ETHAMBUTOL HCL 400 MG TABS (ETHAMBUTOL HCL) take three tablets daily X 6 weeks  #36 x 0   Entered by:   Paulo Fruit  BS,CPht II,MPH   Authorized by:   [name] MD   Signed by:   Paulo Fruit  BS,CPht II,MPH on 03/21/2009   Method used:   Samples Given   RxID:   [id number] AZITHROMYCIN 500 MG TABS (AZITHROMYCIN) take one daily for 6 weeks  #12 x 0   Entered by:   Paulo Fruit  BS,CPht II,MPH   Authorized by:   [name] MD   Signed by:   Paulo Fruit  BS,CPht II,MPH on 03/21/2009   Method used:   Samples Given   RxID:   [id number] COMBIVIR 150-300 MG TABS (LAMIVUDINE-ZIDOVUDINE) Take 1 tablet by mouth two times a day  #60 x 0   Entered by:   Paulo Fruit  BS,CPht II,MPH   Authorized by:   [name] MD   Signed by:   Paulo Fruit  BS,CPht II,MPH on 03/21/2009   Method used:   Samples Given   RxID:   [id number] BACTRIM DS 800-160 MG TABS (SULFAMETHOXAZOLE-TRIMETHOPRIM) Take 1 tablet by mouth once a day  #30 x 0   Entered by:   Paulo Fruit  BS,CPht II,MPH   Authorized by:   [name] MD   Signed by:   Paulo Fruit  BS,CPht II,MPH on 03/21/2009   Method used:   Samples Given   RxID:   [id number]  Patient Assist Medication Verification: Medication name: Ethambutol HCL  400mg  RX # [id number] Tech approval:MLD  Patient Assist Medication Verification: Medication name:Azithromycin 500mg  RX # [id number] Tech approval:MLD  Patient Assist Medication Verification: Medication name: SMZ-TMP DS 800/160mg  RX # [id number] Tech approval:MLD   Patient Assist Medication Verification: Medication:Isentress 400mg  OXB#[id number] Exp Date:11 2012 Tech approval:MLD                Patient Assist Medication Verification: Medication:Intelence 100mg  Lot#[id number] Exp Date:06 2012 Tech approval:MLD                Patient Assist Medication Verification: Medication: Combivir 150/300mg  EQA#[id number] Exp Date:Jul 2014 Tech approval:MLD Call placed to patient with message that assistance medications are ready for pick-up. Left message Paulo Fruit  BS,CPht II,MPH  March 21, 2009 4:56 PM

## 2010-04-18 NOTE — Progress Notes (Signed)
Summary: [employer]/pt assist meds arrived for Mar  Phone Note Refill Request      Prescriptions: ISENTRESS 400 MG TABS (RALTEGRAVIR POTASSIUM) Take 1 tablet by mouth two times a day  #60 x 0   Entered by:   Paulo Fruit  BS,CPht II,MPH   Authorized by:   [name] MD   Signed by:   Paulo Fruit  BS,CPht II,MPH on 05/17/2009   Method used:   Samples Given   RxID:   [id number] INTELENCE 100 MG TABS (ETRAVIRINE) 2 tab by mouth two times a day  #120 x 0   Entered by:   Paulo Fruit  BS,CPht II,MPH   Authorized by:   [name] MD   Signed by:   Paulo Fruit  BS,CPht II,MPH on 05/17/2009   Method used:   Samples Given   RxID:   [id number] COMBIVIR 150-300 MG TABS (LAMIVUDINE-ZIDOVUDINE) Take 1 tablet by mouth two times a day  #60 x 0   Entered by:   Paulo Fruit  BS,CPht II,MPH   Authorized by:   [name] MD   Signed by:   Paulo Fruit  BS,CPht II,MPH on 05/17/2009   Method used:   Samples Given   RxID:   [id number] BACTRIM DS 800-160 MG TABS (SULFAMETHOXAZOLE-TRIMETHOPRIM) Take 1 tablet by mouth once a day  #30 x 0   Entered by:   Paulo Fruit  BS,CPht II,MPH   Authorized by:   [name] MD   Signed by:   Paulo Fruit  BS,CPht II,MPH on 05/17/2009   Method used:   Samples Given   RxID:   [id number]  Patient Assist Medication Verification: Medication name: SMZ-TMP DS 800/160mg  RX # [id number] Tech approval:MLD   Patient Assist Medication Verification: Medication:Combivir 150/300mg  OVF#[id number] Exp Date:Jun 2014 Tech approval:MLD                Patient Assist Medication Verification: Medication:Isentress 400mg  JOA#[id number] Exp Date:02 2013 Tech approval:MLD                Patient Assist Medication Verification: Medication:Intelence 100mg  Lot#[id number] Exp Date:07 2012 Tech approval:MLD Call placed to patient with message that assistance medications are ready for pick-up. Paulo Fruit  BS,CPht II,MPH  May 17, 2009 8:21 AM

## 2010-04-18 NOTE — Miscellaneous (Signed)
  Clinical Lists Changes  Observations: Added new observation of YEARAIDSPOS: 1995  (02/04/2010 11:21)

## 2010-04-18 NOTE — Assessment & Plan Note (Signed)
Summary: 3 MONTH F/U VS   Referring Provider:  n/a Primary Provider:  [name], MD  CC:  3 month follow up.  History of Present Illness: [name] HIV+. He had developed PNA this fall and was admitted and eventually grew MAI from respiratory samples.   CD4 50, VL 316 (05-31-09). Recently had swelling in his feet which has since resolved (noted that his shoes were tight). Recntly gotten over eye infection also.  has started walking totry and loose weight.   Preventive Screening-Counseling & Management  Alcohol-Tobacco     Alcohol drinks/day: occassionally     Smoking Status: quit     Smoking Cessation Counseling: yes     Year Quit: 2007  Caffeine-Diet-Exercise     Caffeine use/day: tea     Does Patient Exercise: yes     Type of exercise: walking     Exercise (avg: min/session): 30-60     Times/week: 7  Safety-Violence-Falls     Seat Belt Use: yes   Updated Prior Medication List: VIAGRA 25 MG TABS (SILDENAFIL CITRATE) as needed BACTRIM DS 800-160 MG TABS (SULFAMETHOXAZOLE-TRIMETHOPRIM) Take 1 tablet by mouth once a day COMBIVIR 150-300 MG TABS (LAMIVUDINE-ZIDOVUDINE) Take 1 tablet by mouth two times a day IRON 325 (65 FE) MG TABS (FERROUS SULFATE) Take 1 tablet by mouth three times a day INTELENCE 100 MG TABS (ETRAVIRINE) 2 tab by mouth two times a day ISENTRESS 400 MG TABS (RALTEGRAVIR POTASSIUM) Take 1 tablet by mouth two times a day FUROSEMIDE 20 MG TABS (FUROSEMIDE) Take 1 tablet by mouth once a day KLOR-CON 10 10 MEQ CR-TABS (POTASSIUM CHLORIDE) Take 1 tablet by mouth once a day  Current Allergies (reviewed today): ! * TENOFOVIR Past History:  Past medical, surgical, family and social histories (including risk factors) reviewed, and no changes noted (except as noted below).  Past Medical History: Reviewed history from 02/21/2009 and no changes required. HIV disease    genotype 01-25-09         Resistance Associated RT Mutations: V118I,T215D/V  Resistance Associated PR Mutations: L10I,I54V,A71V,V82A     genotype 6-07          RT- 41L, 44A, 67N, 118I, 184V, 210W, 215Y, 219N          PR- 3I, 15V, 37N, 60E, 62V, 49M, 72T PCP, presumed, 01-08-09 MAI sputum Cx 01-08-09  Family History: Reviewed history from 04/25/2008 and no changes required. denies   Social History: Reviewed history from 11/29/2008 and no changes required.  Alcohol use-yes- occas beer. Drug use-no single, works as a Environmental consultant Smoker  Vital Signs:  Patient profile:   55 year old male Height:      69 inches (175.26 cm) Weight:      203.8 pounds (92.64 kg) BMI:     30.20 Temp:     97.3 degrees F (36.28 degrees C) oral Pulse rate:   85 / minute BP sitting:   124 / 82  (left arm)  Vitals Entered By: Baxter Hire) (July 11, 2009 9:47 AM) CC: 3 month follow up Pain Assessment Patient in pain? no      Nutritional Status BMI of > 30 = obese Nutritional Status Detail appetite is good per patient  Does patient need assistance? Functional Status Self care Ambulation Normal   Physical Exam  General:  well-developed, well-nourished, and well-hydrated.   Eyes:  pupils equal, pupils round, and pupils reactive to light.   Mouth:  pharynx pink and moist, no exudates, poor dentition,  and teeth missing.   Neck:  no masses.   Lungs:  normal respiratory effort and normal breath sounds.   Heart:  normal rate, regular rhythm, and no murmur.   Abdomen:  soft, non-tender, and normal bowel sounds.          Medication Adherence: 07/11/2009   Adherence to medications reviewed with patient. Counseling to provide adequate adherence provided   Prevention For Positives: 07/11/2009   Safe sex practices discussed with patient. Condoms offered.                             Impression & Recommendations:  Problem # 1:  HIV DISEASE (ICD-042) he is doing well considering his CD4. this is his typical state- low CD4 relatively low VL, overall good  health. he is encouraged to cont to exercise and watch diet. return to clinic 3-4 months. offered condoms.  The following medications were removed from the medication list:    Diflucan 100 Mg Tabs (Fluconazole) .Marland Kitchen... Take 2 pills today and then one pill a day for 9 more days    Azithromycin 500 Mg Tabs (Azithromycin) .Marland Kitchen... Take one daily for 6 weeks    Ethambutol Hcl 400 Mg Tabs (Ethambutol hcl) .Marland Kitchen... Take three tablets daily x 6 weeks His updated medication list for this problem includes:    Bactrim Ds 800-160 Mg Tabs (Sulfamethoxazole-trimethoprim) .Marland Kitchen... Take 1 tablet by mouth once a day  Problem # 2:  * POOR DENTITION will tyr to get him in with dental. given a list of medicaid providers.   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 he took MIA rx for 6 weeks which would be a very short course, especially given his imune system. will watch , tryt o limit his polypharmacy.   Other Orders: Est. Patient Level IV (62130) Future Orders: T-CD4SP ([facility]) (CD4SP) ... 10/09/2009 T-HIV Viral Load [phone]) ... 10/09/2009 T-Comprehensive Metabolic Panel [phone]) ... 10/09/2009 T-CBC w/Diff ([id number]) ... 10/09/2009  Process Orders Check Orders Results:     [facility]: Check successful Tests Sent for requisitioning (July 11, 2009 10:39 AM):     10/09/2009: [facility] -- T-HIV Viral Load [phone] (signed)     10/09/2009: [facility] -- T-Comprehensive Metabolic Panel [[id number]] (signed)     10/09/2009: [facility] -- [facility] w/Diff [[id number]] (signed)

## 2010-04-18 NOTE — Progress Notes (Signed)
Summary: bilateral LE edema x 5-6d, no improvement /p sleeping, no pain  Phone Note Call from Patient Call back at Home Phone [phone]   Caller: Patient Reason for Call: Acute Illness Action Taken: Phone Call Completed, Appt Scheduled Details of Action Taken: Dr. Algis Liming 05/31/2009 @ 0900 Summary of Call: Feet swollen x 5-6 days.  No improvement in the AM after sleeping.  No pain.  Has never had this problem in the past.  Requesting appt.  Jennet Maduro RN  May 30, 2009 1:52 PM

## 2010-04-18 NOTE — Miscellaneous (Signed)
Summary: clinical update/ryan white Not eligible for ADAP  Clinical Lists Changes  Observations: Added new observation of [place]: [place] (05/15/2009 8:56) Added new observation of MEDICARINSUR: Medicare (05/15/2009 8:56) Added new observation of PAYOR: More than 1 (05/15/2009 8:56) Added new observation of PCTFPL: 94.68  (05/15/2009 8:56) Added new observation of FAMILYSIZE: 1  (05/15/2009 8:56) Added new observation of HOUSEINCOME: 16109  (05/15/2009 8:56) Added new observation of FINASSESSDT: 05/15/2009  (05/15/2009 8:56)

## 2010-04-18 NOTE — Assessment & Plan Note (Signed)
Summary: swollen feet for 4-5 days, no improvement in the AM /dde   Referring Provider:  n/a Primary Provider:  [name], MD  CC:  pt. complaining of bilateral foot swelling x 4-5 days.  History of Present Illness: 55 year old AA male with HIV, AIDs, who has not been terribly compliant with most recent viral load around 1400. He had developed PNA this fall and was admitted and eventually grew MAI from respiratory samples. He interestingly did not fit the profile of a typical pt with HIV who succumbs to MAI since it was not isolated from his blood and he also was not a typical pt with chronic lung disease who succumbs to MAI. NOnetheless he has been started on MAI therapy and he reports DRAMATIC improvement since starting these drugs. He claims that he has been hihgly compliant with his salvage reimgen over the past month. HE presents to clinci today with acute onset of lower extremity edema 5 days ago that hav made his shoes difficult to wear. Edema is worse on the left. He has not bee on long car rides, been bedriddena nd he does not have a Equities trader. He still has some DOE but this as mentioned as improved since starting MAI therapy. He denies chest pain. 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 As Needed 2)  Bactrim Ds 800-160 Mg Tabs (Sulfamethoxazole-Trimethoprim) .... Take 1 Tablet By Mouth Once A Day 3)  Diflucan 100 Mg Tabs (Fluconazole) .... Take 2 Pills Today and Then One Pill A Day For 9 More Days 4)  Combivir 150-300 Mg Tabs (Lamivudine-Zidovudine) .... Take 1 Tablet By Mouth Two Times A Day 5)  Iron 325 (65 Fe) Mg Tabs (Ferrous Sulfate) .... Take 1 Tablet By Mouth Three Times A Day 6)  Azithromycin 500 Mg Tabs (Azithromycin) .... Take One Daily For 6 Weeks 7)  Ethambutol Hcl 400 Mg Tabs (Ethambutol Hcl) .... Take Three Tablets Daily X 6 Weeks 8)  Intelence 100 Mg Tabs (Etravirine) .... 2 Tab By Mouth Two Times A Day 9)  Isentress 400 Mg Tabs (Raltegravir Potassium) .... Take 1 Tablet By Mouth Two Times A Day  Current Medications (verified): 1)  Viagra 25 Mg Tabs (Sildenafil Citrate) .... As Needed 2)  Bactrim Ds 800-160 Mg Tabs (Sulfamethoxazole-Trimethoprim) .... Take 1 Tablet By Mouth Once A Day 3)  Diflucan 100 Mg Tabs (Fluconazole) .... Take 2 Pills Today and Then One Pill A Day For 9 More Days 4)  Combivir 150-300 Mg Tabs (Lamivudine-Zidovudine) .... Take 1 Tablet By Mouth Two Times A Day 5)  Iron 325 (65 Fe) Mg Tabs (Ferrous Sulfate) .... Take 1 Tablet By Mouth Three Times A Day 6)  Azithromycin 500 Mg Tabs (Azithromycin) .... Take One Daily For 6 Weeks  7)  Ethambutol Hcl 400 Mg Tabs (Ethambutol Hcl) .... Take Three Tablets Daily X 6 Weeks 8)  Intelence 100 Mg Tabs (Etravirine) .... 2 Tab By Mouth Two Times A Day 9)  Isentress 400 Mg Tabs (Raltegravir Potassium) .... Take 1 Tablet By Mouth Two Times A Day 10)  Furosemide 20 Mg Tabs (Furosemide) .... Take 1 Tablet By Mouth Once A Day 11)  Klor-Con 10 10 Meq Cr-Tabs (Potassium Chloride) .... Take 1 Tablet By Mouth Once A Day  Allergies: 1)  ! * Tenofovir   Preventive Screening-Counseling & Management  Alcohol-Tobacco     Alcohol drinks/day: occassionally     Smoking  Status: quit     Smoking Cessation Counseling: yes     Year Quit: 2007  Caffeine-Diet-Exercise     Caffeine use/day: tea     Does Patient Exercise: yes     Type of exercise: walking     Times/week: 7  Hep-HIV-STD-Contraception     HIV Risk Counseling: 03/26/2005     Wynelle Link Exposure-Excessive: occasionally  Safety-Violence-Falls     Seat Belt Use: yes   Current Allergies (reviewed today): ! * TENOFOVIR Past History:  Past Medical History: Last updated: 02/21/2009 HIV disease    genotype 01-25-09         Resistance Associated RT Mutations: V118I,T215D/V         Resistance Associated PR Mutations: L10I,I54V,A71V,V82A     genotype 6-07          RT- 41L, 44A, 67N, 118I, 184V, 210W, 215Y, 219N          PR- 3I, 15V, 37N, 60E, 62V, 24M, 72T PCP, presumed, 01-08-09 MAI sputum Cx 01-08-09  Family History: Last updated: 04/25/2008 denies   Social History: Last updated: 11/29/2008  Alcohol use-yes- occas beer. Drug use-no single, works as a Naval architect Former Smoker  Risk Factors: Alcohol Use: occassionally (05/31/2009) Caffeine Use: tea (05/31/2009) Exercise: yes (05/31/2009)  Risk Factors: Smoking Status: quit (05/31/2009)  Review of Systems  The patient denies anorexia, fever, weight loss, weight gain, vision loss, decreased hearing, hoarseness, chest pain, syncope, dyspnea on exertion, peripheral edema, prolonged cough, headaches, hemoptysis, abdominal pain, melena, hematochezia, severe indigestion/heartburn, hematuria, incontinence, genital sores, muscle weakness, suspicious skin lesions, transient blindness, difficulty walking, depression, unusual weight change, abnormal bleeding, and enlarged lymph nodes.    Vital Signs:  Patient profile:   55 year old male Height:      69 inches (175.26 cm) Weight:      201.7 pounds (91.68 kg) BMI:     29.89 Temp:     97.1 degrees F (36.17 degrees C) oral Pulse rate:   73 / minute BP sitting:   116 / 77  (left arm)  Vitals  Entered By: Wendall Mola CMA Duncan Dull) (May 31, 2009 8:48 AM) CC: pt. complaining of bilateral foot swelling x 4-5 days Is Patient Diabetic? No Pain Assessment Patient in pain? no      Nutritional Status BMI of 25 - 29 = overweight Nutritional Status Detail appetite "good"  Does patient need assistance? Functional Status Self care Ambulation Normal Comments no missed doses of meds per patient   Physical Exam  General:  well-developed, well-nourished, and well-hydrated.   Head:  normocephalic and atraumatic.   Eyes:  pupils equal, pupils round, pupils reactive to light Ears:  no external deformities.   Nose:  no external erythema and no nasal discharge.   Mouth:  pharynx pink and moist and no exudates.   Neck:  supple and full ROM.   Lungs:  normal respiratory effort and normal breath sounds.  no crackles and no wheezes.   Heart:  normal rate, regular rhythm, and no murmur.   Abdomen:  soft, non-tender, normal bowel sounds, and no distention.   Msk:  normal ROM and no joint tenderness.   Extremities:  He has 2+ pretibial edema worse on the left thant the right Neurologic:  alert & oriented X3, strength normal in all extremities, and gait normal.   Psych:  Oriented X3, memory intact for recent and remote, and normally interactive.          Medication Adherence: 05/31/2009   Adherence to medications reviewed with patient. Counseling to provide adequate adherence provided   Prevention For Positives: 05/31/2009   Safe sex practices discussed with patient. Condoms offered.   Education Materials Provided: 05/31/2009 Safe sex practices discussed with patient. Condoms offered.                          Impression & Recommendations:  Problem # 1:  HIV DISEASE (ICD-042)  rechecking his labs today. He claims to have been more compliant since he last saw Dr. Ninetta Lights. His updated medication list for this problem includes:    Bactrim Ds 800-160 Mg Tabs  (Sulfamethoxazole-trimethoprim) .Marland Kitchen... Take 1 tablet by mouth once a day    Diflucan 100 Mg Tabs (Fluconazole) .Marland Kitchen... Take 2 pills today and then one pill a day for 9 more days    Azithromycin 500 Mg Tabs (Azithromycin) .Marland Kitchen... Take one daily for 6 weeks    Ethambutol Hcl 400 Mg Tabs (Ethambutol hcl) .Marland Kitchen... Take three tablets daily x 6 weeks  Orders: Est. Patient Level V ([address])  Problem # 2:  EDEMA (ICD-782.3) I ordered stat dopplers which were negative. Got BNP which was negative, checked cmp to see what his albumin was, also ordered TSH. I gave him a rx for lasix 20mg  with of KCl. He may benefit from sleep study but he wanted to see what Dr Ninetta Lights thought about this first and I think this is quite reaonable. His updated medication list for this problem includes:    Furosemide 20 Mg Tabs (Furosemide) .Marland Kitchen... Take 1 tablet by mouth once a day  Orders: T-BNP  (B Natriuretic Peptide) ([id number]) T-TSH ([id number]) 2 D Echo (2 D Echo) LE Venous Duplex (DVT) (DVT) Est. 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 Certainly pts who simply have HIV usually succumb to disseminated MAI but not pulmonary MAI, though I know of a pt with HIV and COPD who most defintiely DID have MAI infection, though again  he had the known risk factor for pulmonary MAI in non HIV and HIV, that being chronic pulmnary disease. That being said this pt claims to have improved pulmonary symptoms  on mai therapy. I will defer to Dr. Ninetta Lights on this case. THe main issue I raise question of whether he truly had MAI infection of his lungs is that he seems to be doign such a terrible job of adhering to his ARVs and I thought to try to reduce his pill burden  Orders: Est. Patient Level V (57846)  Problem # 4:  SOB (ICD-786.05)  still has some DOE but better with anti MAI therapy, BNP checked and  normal  Orders: Est. Patient Level V (96295)  Problem # 5:  SNORING (ICD-786.09)  Given h xof snoring with LE edema, this raises possibility of OSA and think we should conteplate screning him for it. He does not have an esp large neck, daytime somnolence though. Perhaps  His updated medication list for this problem includes:    Furosemide 20 Mg Tabs (Furosemide) .Marland Kitchen... Take 1 tablet by mouth once a day  Orders: Est. Patient Level V (42706)  Medications Added to Medication List This Visit: 1)  Furosemide 20 Mg Tabs (Furosemide) .... Take 1 tablet by mouth once a day 2)  Klor-con 10 10 Meq Cr-tabs (Potassium chloride) .... Take 1 tablet by mouth once a day  Other Orders: T-CD4SP ([facility]) (CD4SP) T-HIV Viral Load [phone]) T-Comprehensive Metabolic Panel [id number]) T-CBC w/Diff [phone])   Prescriptions: KLOR-CON 10 10 MEQ CR-TABS (POTASSIUM CHLORIDE) Take 1 tablet by mouth once a day  #30 x 1   Entered and Authorized by:   Acey Lav MD   Signed by:   Paulette Blanch Dam MD on 05/31/2009   Method used:   Electronically to        [facility]  [address] [phone]* (retail)       [address]       [place], [place]  [address]       Ph: [phone] or [phone]       Fax: [phone]   RxID:   [id number] FUROSEMIDE 20 MG TABS (FUROSEMIDE) Take 1 tablet by mouth once a day  #30 x 1   Entered and Authorized by:   Acey Lav MD   Signed by:   Paulette Blanch Dam MD on 05/31/2009   Method used:   Electronically to        [facility]  [address] [phone]* (retail)       [address]       [place], [place]  [address]       Ph: [phone] or [phone]       Fax: [phone]   RxID:   [id number]  Process Orders Check Orders Results:     [facility]: Check successful Tests Sent for requisitioning (May 31, 2009 10:52 PM):     05/31/2009: [facility] -- T-HIV Viral Load [phone] (signed)     05/31/2009:  [facility] -- T-Comprehensive Metabolic Panel [[id number]] (signed)     05/31/2009: [facility] -- T-CBC w/Diff [[id number]] (signed)     05/31/2009: [facility] -- T-BNP  (B Natriuretic Peptide) [phone] (signed)     05/31/2009: [facility] -- T-TSH [phone] (signed)

## 2010-04-18 NOTE — Progress Notes (Signed)
Summary: NCADAP/SPAP meds arrived for April  Phone Note Refill Request      Prescriptions: ISENTRESS 400 MG TABS (RALTEGRAVIR POTASSIUM) Take 1 tablet by mouth two times a day  #60 x 0   Entered by:   Paulo Fruit  BS,CPht II,MPH   Authorized by:   [name] MD   Signed by:   Paulo Fruit  BS,CPht II,MPH on 06/19/2009   Method used:   Samples Given   RxID:   [id number] INTELENCE 100 MG TABS (ETRAVIRINE) 2 tab by mouth two times a day  #120 x 0   Entered by:   Paulo Fruit  BS,CPht II,MPH   Authorized by:   [name] MD   Signed by:   Paulo Fruit  BS,CPht II,MPH on 06/19/2009   Method used:   Samples Given   RxID:   [id number] COMBIVIR 150-300 MG TABS (LAMIVUDINE-ZIDOVUDINE) Take 1 tablet by mouth two times a day  #60 x 0   Entered by:   Paulo Fruit  BS,CPht II,MPH   Authorized by:   [name] MD   Signed by:   Paulo Fruit  BS,CPht II,MPH on 06/19/2009   Method used:   Samples Given   RxID:   [id number] BACTRIM DS 800-160 MG TABS (SULFAMETHOXAZOLE-TRIMETHOPRIM) Take 1 tablet by mouth once a day  #30 x 0   Entered by:   Paulo Fruit  BS,CPht II,MPH   Authorized by:   [name] MD   Signed by:   Paulo Fruit  BS,CPht II,MPH on 06/19/2009   Method used:   Samples Given   RxID:   [id number]  Patient Assist Medication Verification: Medication name: SMZ-TMP DS 800/160mg  RX # [id number] Tech approval:MLD   Patient Assist Medication Verification: Medication: Intelence 100mg  Lot#[id number] Exp Date:08 2012 Tech approval:MLD                Patient Assist Medication Verification: Medication:Isentress 400mg  Lot# [id number] Exp Date:08 2012 Tech approval:MLD                Patient Assist Medication Verification: Medication:Combivir 150/300mg  Lot# [id number] Exp Date:oct 2014 Tech approval:MLD Call placed to patient with message that assistance medications are ready for pick-up. Paulo Fruit  BS,CPht II,MPH  June 19, 2009 3:18 PM

## 2010-04-18 NOTE — Miscellaneous (Signed)
Summary: HIPAA Restrictions  HIPAA Restrictions   Imported By: Florinda Marker 07/12/2009 08:56:29  _____________________________________________________________________  External Attachment:    Type:   Image     Comment:   External Document

## 2010-05-29 LAB — T-HELPER CELL (CD4) - (RCID CLINIC ONLY): CD4 T Cell Abs: 100 uL — ABNORMAL LOW (ref 400–2700)

## 2010-06-18 ENCOUNTER — Other Ambulatory Visit: Payer: Self-pay | Admitting: Licensed Clinical Social Worker

## 2010-06-18 DIAGNOSIS — B029 Zoster without complications: Secondary | ICD-10-CM

## 2010-06-18 MED ORDER — VALACYCLOVIR HCL 1 G PO TABS: 1000.0000 mg | ORAL_TABLET | Freq: Three times a day (TID) | ORAL | Status: DC

## 2010-06-19 LAB — T-HELPER CELL (CD4) - (RCID CLINIC ONLY): CD4 % Helper T Cell: 1 % — ABNORMAL LOW (ref 33–55)

## 2010-06-20 LAB — BASIC METABOLIC PANEL
BUN: 10 mg/dL (ref 6–23)
CO2: 18 mEq/L — ABNORMAL LOW (ref 19–32)
Calcium: 9.2 mg/dL (ref 8.4–10.5)
Chloride: 104 mEq/L (ref 96–112)
Chloride: 105 mEq/L (ref 96–112)
Chloride: 105 mEq/L (ref 96–112)
Chloride: 107 mEq/L (ref 96–112)
Creatinine, Ser: 1.05 mg/dL (ref 0.4–1.5)
GFR calc Af Amer: >60 mL/min (ref >60)
GFR calc Af Amer: >60 mL/min (ref >60)
GFR calc non Af Amer: >60 mL/min (ref >60)
Glucose, Bld: 113 mg/dL — ABNORMAL HIGH (ref 70–99)
Glucose, Bld: 91 mg/dL (ref 70–99)
Potassium: 3.7 mEq/L (ref 3.5–5.1)
Potassium: 3.9 mEq/L (ref 3.5–5.1)
Potassium: 4.3 mEq/L (ref 3.5–5.1)
Potassium: 4.3 mEq/L (ref 3.5–5.1)
Sodium: 134 mEq/L — ABNORMAL LOW (ref 135–145)
Sodium: 134 mEq/L — ABNORMAL LOW (ref 135–145)

## 2010-06-20 LAB — COMPREHENSIVE METABOLIC PANEL
AST: 46 U/L — ABNORMAL HIGH (ref 0–37)
Albumin: 3.3 g/dL — ABNORMAL LOW (ref 3.5–5.2)
BUN: 9 mg/dL (ref 6–23)
CO2: 20 mEq/L (ref 19–32)
Calcium: 8.5 mg/dL (ref 8.4–10.5)
Creatinine, Ser: 0.93 mg/dL (ref 0.4–1.5)
GFR calc Af Amer: >60 mL/min (ref >60)
GFR calc non Af Amer: >60 mL/min (ref >60)

## 2010-06-20 LAB — CROSSMATCH
ABO/RH(D): O POS
Antibody Screen: NEGATIVE

## 2010-06-20 LAB — DIFFERENTIAL
Basophils Absolute: 0 10*3/uL (ref 0.0–0.1)
Basophils Relative: 0 % (ref 0–1)
Eosinophils Absolute: 0.4 10*3/uL (ref 0.0–0.7)
Eosinophils Relative: 9 % — ABNORMAL HIGH (ref 0–5)
Lymphocytes Relative: 35 % (ref 12–46)
Lymphs Abs: 1.6 K/uL (ref 0.7–4.0)
Monocytes Absolute: 0.6 K/uL (ref 0.1–1.0)
Monocytes Relative: 14 % — ABNORMAL HIGH (ref 3–12)
Neutro Abs: 1.9 10*3/uL (ref 1.7–7.7)
Neutrophils Relative %: 42 % — ABNORMAL LOW (ref 43–77)

## 2010-06-20 LAB — LEGIONELLA ANTIGEN, URINE: Legionella Antigen, Urine: NEGATIVE

## 2010-06-20 LAB — URINALYSIS, ROUTINE W REFLEX MICROSCOPIC
Bilirubin Urine: NEGATIVE
Glucose, UA: NEGATIVE mg/dL
Hgb urine dipstick: NEGATIVE
Ketones, ur: NEGATIVE mg/dL
Nitrite: NEGATIVE
Protein, ur: NEGATIVE mg/dL
Specific Gravity, Urine: 1.018 (ref 1.005–1.030)
Urobilinogen, UA: 0.2 mg/dL (ref 0.0–1.0)
pH: 6 (ref 5.0–8.0)

## 2010-06-20 LAB — COMPREHENSIVE METABOLIC PANEL WITH GFR
ALT: 51 U/L (ref 0–53)
Alkaline Phosphatase: 76 U/L (ref 39–117)
Chloride: 105 meq/L (ref 96–112)
Glucose, Bld: 126 mg/dL — ABNORMAL HIGH (ref 70–99)
Potassium: 3.6 meq/L (ref 3.5–5.1)
Sodium: 134 meq/L — ABNORMAL LOW (ref 135–145)
Total Bilirubin: 0.5 mg/dL (ref 0.3–1.2)
Total Protein: 8 g/dL (ref 6.0–8.3)

## 2010-06-20 LAB — AFB CULTURE WITH SMEAR (NOT AT ARMC)
Acid Fast Smear: NONE SEEN
Acid Fast Smear: NONE SEEN

## 2010-06-20 LAB — CULTURE, BLOOD (ROUTINE X 2)
Culture: NO GROWTH
Culture: NO GROWTH

## 2010-06-20 LAB — CBC
HCT: 24.9 % — ABNORMAL LOW (ref 39.0–52.0)
HCT: 24.9 % — ABNORMAL LOW (ref 39.0–52.0)
HCT: 25.5 % — ABNORMAL LOW (ref 39.0–52.0)
HCT: 27.4 % — ABNORMAL LOW (ref 39.0–52.0)
HCT: 28.2 % — ABNORMAL LOW (ref 39.0–52.0)
Hemoglobin: 7.6 g/dL — CL (ref 13.0–17.0)
Hemoglobin: 7.8 g/dL — CL (ref 13.0–17.0)
Hemoglobin: 8.3 g/dL — ABNORMAL LOW (ref 13.0–17.0)
MCHC: 30.7 g/dL (ref 30.0–36.0)
MCHC: 30.7 g/dL (ref 30.0–36.0)
MCV: 61.5 fL — ABNORMAL LOW (ref 78.0–100.0)
MCV: 61.9 fL — ABNORMAL LOW (ref 78.0–100.0)
MCV: 62.8 fL — ABNORMAL LOW (ref 78.0–100.0)
Platelets: 331 10*3/uL (ref 150–400)
Platelets: 338 10*3/uL (ref 150–400)
RBC: 4.02 MIL/uL — ABNORMAL LOW (ref 4.22–5.81)
RBC: 4.13 MIL/uL — ABNORMAL LOW (ref 4.22–5.81)
RBC: 4.49 MIL/uL (ref 4.22–5.81)
RDW: 20.5 % — ABNORMAL HIGH (ref 11.5–15.5)
RDW: 21.3 % — ABNORMAL HIGH (ref 11.5–15.5)
RDW: 21.8 % — ABNORMAL HIGH (ref 11.5–15.5)
WBC: 4.5 K/uL (ref 4.0–10.5)
WBC: 4.9 10*3/uL (ref 4.0–10.5)
WBC: 6.9 10*3/uL (ref 4.0–10.5)

## 2010-06-20 LAB — BLOOD GAS, ARTERIAL
Acid-base deficit: 2.4 mmol/L — ABNORMAL HIGH (ref 0.0–2.0)
Bicarbonate: 21.1 mEq/L (ref 20.0–24.0)
O2 Saturation: 96 %
Patient temperature: 98.5
TCO2: 22.1 mmol/L (ref 0–100)
pH, Arterial: 7.44 (ref 7.350–7.450)

## 2010-06-20 LAB — T-HELPER CELLS (CD4) COUNT (NOT AT ARMC): CD4 % Helper T Cell: <1 % — ABNORMAL LOW (ref 33–55)

## 2010-06-20 LAB — HIV-1 RNA QUANT-NO REFLEX-BLD: HIV-1 RNA Quant, Log: 4.64 {Log} — ABNORMAL HIGH (ref <1.68)

## 2010-06-20 LAB — D-DIMER, QUANTITATIVE: D-Dimer, Quant: 0.51 ug{FEU}/mL — ABNORMAL HIGH (ref 0.00–0.48)

## 2010-06-20 LAB — CARDIAC PANEL(CRET KIN+CKTOT+MB+TROPI)
Relative Index: INVALID (ref 0.0–2.5)
Total CK: 51 U/L (ref 7–232)

## 2010-06-20 LAB — PNEUMOCYSTIS JIROVECI SMEAR BY DFA: Pneumocystis jiroveci Ag: NOT DETECTED

## 2010-06-20 LAB — CULTURE, RESPIRATORY W GRAM STAIN

## 2010-06-20 LAB — HIV 1/2 CONFIRMATION
HIV-1 antibody: POSITIVE
HIV-2 Ab: NEGATIVE

## 2010-06-20 LAB — HIV ANTIBODY (ROUTINE TESTING W REFLEX): HIV: REACTIVE — AB

## 2010-06-20 LAB — EXPECTORATED SPUTUM ASSESSMENT W GRAM STAIN, RFLX TO RESP C

## 2010-06-20 LAB — VITAMIN B12: Vitamin B-12: 551 pg/mL (ref 211–911)

## 2010-06-20 LAB — HEMOCCULT GUIAC POC 1CARD (OFFICE): Fecal Occult Bld: NEGATIVE

## 2010-06-20 LAB — BRAIN NATRIURETIC PEPTIDE: Pro B Natriuretic peptide (BNP): <30 pg/mL (ref 0.0–100.0)

## 2010-06-20 LAB — RETICULOCYTES: Retic Count, Absolute: 47.5 10*3/uL (ref 19.0–186.0)

## 2010-06-20 LAB — CORTISOL-AM, BLOOD: Cortisol - AM: 14 ug/dL (ref 4.3–22.4)

## 2010-06-20 LAB — TECHNOLOGIST SMEAR REVIEW

## 2010-06-20 LAB — LACTATE DEHYDROGENASE: LDH: 215 U/L (ref 94–250)

## 2010-06-20 LAB — IRON AND TIBC
Saturation Ratios: 37 % (ref 20–55)
UIBC: 243 ug/dL

## 2010-06-20 LAB — STREP PNEUMONIAE URINARY ANTIGEN: Strep Pneumo Urinary Antigen: NEGATIVE

## 2010-06-20 LAB — FERRITIN: Ferritin: 10 ng/mL — ABNORMAL LOW (ref 22–322)

## 2010-06-21 LAB — T-HELPER CELL (CD4) - (RCID CLINIC ONLY): CD4 % Helper T Cell: <1 % — ABNORMAL LOW (ref 33–55)

## 2010-07-01 LAB — T-HELPER CELL (CD4) - (RCID CLINIC ONLY): CD4 T Cell Abs: 10 uL — ABNORMAL LOW (ref 400–2700)

## 2010-07-15 ENCOUNTER — Other Ambulatory Visit (INDEPENDENT_AMBULATORY_CARE_PROVIDER_SITE_OTHER): Payer: Medicare Other

## 2010-07-15 DIAGNOSIS — Z113 Encounter for screening for infections with a predominantly sexual mode of transmission: Secondary | ICD-10-CM

## 2010-07-15 DIAGNOSIS — Z79899 Other long term (current) drug therapy: Secondary | ICD-10-CM | POA: Insufficient documentation

## 2010-07-15 DIAGNOSIS — D509 Iron deficiency anemia, unspecified: Secondary | ICD-10-CM

## 2010-07-15 DIAGNOSIS — B20 Human immunodeficiency virus [HIV] disease: Secondary | ICD-10-CM

## 2010-07-15 LAB — CBC WITH DIFFERENTIAL/PLATELET
Basophils Relative: 1 % (ref 0–1)
Eosinophils Absolute: 0.7 10*3/uL (ref 0.0–0.7)
Eosinophils Relative: 18 % — ABNORMAL HIGH (ref 0–5)
Hemoglobin: 13.7 g/dL (ref 13.0–17.0)
MCH: 28.3 pg (ref 26.0–34.0)
MCHC: 33.9 g/dL (ref 30.0–36.0)
Monocytes Relative: 12 % (ref 3–12)
Neutrophils Relative %: 44 % (ref 43–77)
Platelets: 258 10*3/uL (ref 150–400)

## 2010-07-15 LAB — COMPREHENSIVE METABOLIC PANEL
Alkaline Phosphatase: 80 U/L (ref 39–117)
BUN: 8 mg/dL (ref 6–23)
Creat: 0.76 mg/dL (ref 0.40–1.50)
Glucose, Bld: 97 mg/dL (ref 70–99)
Sodium: 133 mEq/L — ABNORMAL LOW (ref 135–145)
Total Bilirubin: 0.4 mg/dL (ref 0.3–1.2)
Total Protein: 8.5 g/dL — ABNORMAL HIGH (ref 6.0–8.3)

## 2010-07-15 LAB — LIPID PANEL
Cholesterol: 145 mg/dL (ref 0–200)
HDL: 34 mg/dL — ABNORMAL LOW (ref >39)
Triglycerides: 73 mg/dL (ref <150)

## 2010-07-15 LAB — RPR

## 2010-07-17 LAB — HIV-1 RNA QUANT-NO REFLEX-BLD: HIV-1 RNA Quant, Log: 5.14 {Log} — ABNORMAL HIGH (ref <1.30)

## 2010-07-29 ENCOUNTER — Telehealth: Payer: Self-pay | Admitting: *Deleted

## 2010-07-29 ENCOUNTER — Ambulatory Visit (INDEPENDENT_AMBULATORY_CARE_PROVIDER_SITE_OTHER): Payer: Medicare Other | Admitting: Infectious Diseases

## 2010-07-29 ENCOUNTER — Encounter: Payer: Self-pay | Admitting: Infectious Diseases

## 2010-07-29 ENCOUNTER — Ambulatory Visit (HOSPITAL_COMMUNITY)
Admission: RE | Admit: 2010-07-29 | Discharge: 2010-07-29 | Disposition: A | Discharge status: Home / Self Care | Payer: Medicare Other | Source: Ambulatory Visit | Attending: Infectious Diseases | Admitting: Infectious Diseases

## 2010-07-29 DIAGNOSIS — B20 Human immunodeficiency virus [HIV] disease: Secondary | ICD-10-CM

## 2010-07-29 DIAGNOSIS — J449 Chronic obstructive pulmonary disease, unspecified: Secondary | ICD-10-CM | POA: Insufficient documentation

## 2010-07-29 DIAGNOSIS — J4489 Other specified chronic obstructive pulmonary disease: Secondary | ICD-10-CM | POA: Insufficient documentation

## 2010-07-29 DIAGNOSIS — R0602 Shortness of breath: Secondary | ICD-10-CM | POA: Insufficient documentation

## 2010-07-29 MED ORDER — SULFAMETHOXAZOLE-TMP DS 800-160 MG PO TABS: 1.0000 | ORAL_TABLET | Freq: Two times a day (BID) | ORAL | Status: DC

## 2010-07-29 NOTE — Assessment & Plan Note (Signed)
Pt is off meds, will have him meet with assistance personal to get him restarted. Will see him back in 2-4 weeks.

## 2010-07-29 NOTE — Progress Notes (Signed)
  Subjective:    Patient ID: RONDA KAZMI, male    DOB: Jun 13, 1955, 55 y.o.   MRN: [id number]  HPI [name] HIV+ He had developed PNA this fall and was admitted and eventually grew MAI from respiratory samples.  CD4 60, VL 138,000 (07-15-10). Today has concerns about SOB, cough and fatigue. Non-prod cough. Coughs when he has to take a deep breath. DOE. Otherwise feels well. Has been off his meds > week. His medicare ran out and he also ran out of his meds.   Review of Systems  Constitutional: Negative for fever, chills, appetite change and unexpected weight change.  SPO2 96% RA     Objective:   Physical Exam  Constitutional: He appears well-developed and well-nourished. No distress.  HENT:  Mouth/Throat:    Eyes: EOM are normal.  Neck: Neck supple.  Cardiovascular: Regular rhythm.  Tachycardia present.   Pulmonary/Chest: Effort normal. He has no wheezes. He has no rales.       Decreased breath sounds  Abdominal: Soft. Bowel sounds are normal. He exhibits no distension.  Musculoskeletal: He exhibits no edema.  Skin: He is not diaphoretic.          Assessment & Plan:

## 2010-07-29 NOTE — Telephone Encounter (Signed)
Called patient to notify of Pulmonologist Appt. With [facility] on 08/15/10 at 10:30 AM with Dr. Hetty Ely CMA

## 2010-07-29 NOTE — Progress Notes (Signed)
  Subjective:    Patient ID: Paul [name], male    DOB: 1956-01-24, 55 y.o.   MRN: [id number]  HPI    Review of Systems     Objective:   Physical Exam        Assessment & Plan:

## 2010-07-29 NOTE — Assessment & Plan Note (Addendum)
Will check a CXR on him. His SPO2 on RA is 96%. There is a wide DDX here- MAI, PCP, viral, given his low CD4. Will see him back in 2-4 weeks. Restart empiric PCP rx.  Spoke with radiology his CXR is "stable chronic lung disease, asymptomatic fibrosis, no acute". He may benefit from pulmonary eval.

## 2010-07-31 ENCOUNTER — Telehealth: Payer: Self-pay | Admitting: Infectious Diseases

## 2010-07-31 DIAGNOSIS — R609 Edema, unspecified: Secondary | ICD-10-CM

## 2010-07-31 DIAGNOSIS — R0602 Shortness of breath: Secondary | ICD-10-CM

## 2010-07-31 DIAGNOSIS — B029 Zoster without complications: Secondary | ICD-10-CM

## 2010-07-31 DIAGNOSIS — B20 Human immunodeficiency virus [HIV] disease: Secondary | ICD-10-CM

## 2010-07-31 MED ORDER — RALTEGRAVIR POTASSIUM 400 MG PO TABS: 400.0000 mg | ORAL_TABLET | Freq: Two times a day (BID) | ORAL | Status: DC

## 2010-07-31 MED ORDER — VALACYCLOVIR HCL 1 G PO TABS: 1000.0000 mg | ORAL_TABLET | Freq: Three times a day (TID) | ORAL | Status: DC

## 2010-07-31 MED ORDER — POTASSIUM CHLORIDE 10 MEQ PO TBCR: 10.0000 meq | EXTENDED_RELEASE_TABLET | Freq: Every day | ORAL | Status: DC

## 2010-07-31 MED ORDER — FUROSEMIDE 20 MG PO TABS: 20.0000 mg | ORAL_TABLET | Freq: Every day | ORAL | Status: DC

## 2010-07-31 MED ORDER — ETRAVIRINE 100 MG PO TABS: 200.0000 mg | ORAL_TABLET | Freq: Two times a day (BID) | ORAL | Status: DC

## 2010-07-31 MED ORDER — SULFAMETHOXAZOLE-TMP DS 800-160 MG PO TABS: 1.0000 | ORAL_TABLET | Freq: Two times a day (BID) | ORAL | Status: DC

## 2010-07-31 MED ORDER — LAMIVUDINE-ZIDOVUDINE 150-300 MG PO TABS: 1.0000 | ORAL_TABLET | Freq: Two times a day (BID) | ORAL | Status: DC

## 2010-07-31 NOTE — Telephone Encounter (Signed)
Paul [name] was in today to sign applications for assistance with medications and to complete his ADAP applications.  We are applying to [employer] for Viagra and to [facility] for Lasix and Klor-Con.  Once the applications and prescriptions have been signed by the provider, they will be sent off.Antionette Fairy

## 2010-07-31 NOTE — Telephone Encounter (Signed)
Paul [name] came in today to sign applications for assistance with medications and bring financial documentation.  We completed apps to [facility] for Lasix and Klor-Con and to [employer] for Viagra and we did his ADAP applications for his HIV meds.  We were not able to locate a PAP for Ferrous Sulfate, however I informed him that he can get a 90 day supply from [facility] for $15.  Once the applications are signed of by the MD and the prescriptions are printed and signed, the apps will be sent off.Antionette Fairy

## 2010-08-02 ENCOUNTER — Other Ambulatory Visit: Payer: Self-pay | Admitting: Infectious Diseases

## 2010-08-02 DIAGNOSIS — N529 Male erectile dysfunction, unspecified: Secondary | ICD-10-CM

## 2010-08-02 MED ORDER — SILDENAFIL CITRATE 25 MG PO TABS: 25.0000 mg | ORAL_TABLET | ORAL | Status: DC
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
## 2010-08-02 NOTE — Telephone Encounter (Signed)
Applications for ADAP, [employer]/Connection to Care and Rx Outreach were mailed today.Antionette Fairy

## 2010-08-07 NOTE — Telephone Encounter (Signed)
Per Lupita Leash at [employer], they do not have the app;ication in their systemm yet. Told her we will wait to hear from them or until next Wednesday when I call again

## 2010-08-07 NOTE — Telephone Encounter (Signed)
Call [employer]fizer again

## 2010-08-09 ENCOUNTER — Other Ambulatory Visit (INDEPENDENT_AMBULATORY_CARE_PROVIDER_SITE_OTHER): Payer: Medicare Other | Admitting: *Deleted

## 2010-08-09 DIAGNOSIS — R0602 Shortness of breath: Secondary | ICD-10-CM

## 2010-08-09 DIAGNOSIS — B20 Human immunodeficiency virus [HIV] disease: Secondary | ICD-10-CM

## 2010-08-09 MED ORDER — ETRAVIRINE 100 MG PO TABS: 200.0000 mg | ORAL_TABLET | Freq: Two times a day (BID) | ORAL | Status: DC

## 2010-08-09 MED ORDER — RALTEGRAVIR POTASSIUM 400 MG PO TABS: 400.0000 mg | ORAL_TABLET | Freq: Two times a day (BID) | ORAL | Status: DC

## 2010-08-09 MED ORDER — SULFAMETHOXAZOLE-TMP DS 800-160 MG PO TABS: 1.0000 | ORAL_TABLET | Freq: Two times a day (BID) | ORAL | Status: DC

## 2010-08-09 MED ORDER — LAMIVUDINE-ZIDOVUDINE 150-300 MG PO TABS: 1.0000 | ORAL_TABLET | Freq: Two times a day (BID) | ORAL | Status: DC

## 2010-08-14 ENCOUNTER — Encounter: Payer: Self-pay | Admitting: Internal Medicine

## 2010-08-14 NOTE — Telephone Encounter (Signed)
Call placed to [employer] to check status of application for Viagra.  The application was returned because the "Other" box for his Prescription Box was not checked off.  I will contact Paul [name] to bring the original application in to the office for me to correct and resend.Cammie Mcgee

## 2010-08-15 ENCOUNTER — Encounter: Payer: Self-pay | Admitting: Internal Medicine

## 2010-08-15 ENCOUNTER — Ambulatory Visit (INDEPENDENT_AMBULATORY_CARE_PROVIDER_SITE_OTHER): Payer: Medicare Other | Admitting: Internal Medicine

## 2010-08-15 VITALS — BP 98/60 | HR 76 | Temp 98.3°F | Ht 69.0 in | Wt 188.2 lb

## 2010-08-15 DIAGNOSIS — R0602 Shortness of breath: Secondary | ICD-10-CM

## 2010-08-15 NOTE — Patient Instructions (Signed)
You need to start your medications as soon as possible because you may have a low grade form of pcp pneumonia  Please schedule a follow up office visit in 6 weeks, call sooner if needed with cxr and pft's on return

## 2010-08-15 NOTE — Progress Notes (Signed)
Subjective:     Patient ID: Paul [name], male   DOB: 26-Dec-1955, 55 y.o.   MRN: [id number]  HPI  54 yobm quit smoking around 2002 with no resp problems then around 2007 sob much worse for sev weeks/months eventually  better not sure what rx but not eval by pulmonary or on pulmonary meds then that he recalls,  then recurrent indolent onset progressive doe x sev months  while not any meds for HIV and referred to pulmonary clinic by [facility] for eval   08/15/2010 Initial pulmonary office eval in EMR era cc sob x sev months with exertion (walking flat x 50 ft) assoc with intermittent dry cough, still not back on HIV meds due to insurance issues, with last CD4 count 60 07/15/10 but no fever on prophylactic dose of bactrim. Sleeping ok without nocturnal  or early am exac of resp c/o's or need for noct saba.   Pt denies any significant sore throat, dysphagia, itching, sneezing,  nasal congestion or excess/ purulent secretions,  fever, chills, sweats, unintended wt loss, pleuritic or exertional cp, hempoptysis, orthopnea pnd or leg swelling.    Also denies any obvious fluctuation of symptoms with weather or environmental changes or other aggravating or alleviating factors.      Review of Systems  Constitutional: Negative for fever, chills, activity change, appetite change and unexpected weight change.  HENT: Negative for congestion, sore throat, rhinorrhea, sneezing, trouble swallowing, dental problem, voice change and postnasal drip.   Eyes: Negative for visual disturbance.  Respiratory: Positive for cough and shortness of breath. Negative for choking.   Cardiovascular: Negative for chest pain and leg swelling.  Gastrointestinal: Negative for nausea, vomiting and abdominal pain.  Genitourinary: Negative for difficulty urinating.  Musculoskeletal: Negative for arthralgias.  Skin: Negative for rash.  Psychiatric/Behavioral: Negative for behavioral problems and confusion.       Objective:   Physical Exam    pleasant bm amb nad  Wt 188 08/15/2010  HEENT mild turbinate edema.  Oropharynx no thrush or excess pnd or cobblestoning.  No JVD or cervical adenopathy. Mild accessory muscle hypertrophy. Trachea midline, nl thryroid. Chest was hyperinflated by percussion with diminished breath sounds and moderate increased exp time without wheeze. Hoover sign positive at mid inspiration. Regular rate and rhythm without murmur gallop or rub or increase P2 or edema.  Abd: no hsm, nl excursion. Ext warm without cyanosis or clubbing.    cxr 07/29/10 Stable chronic lung disease with volume loss and asymmetric fibrosis on the left. No acute superimposed findings identified.    Assessment:         Plan:

## 2010-08-17 ENCOUNTER — Encounter: Payer: Self-pay | Admitting: Internal Medicine

## 2010-08-17 NOTE — Assessment & Plan Note (Signed)
Given discrepancy between exam and sats main concern here is low grade PCP in pt with CD4 of only 10 but will defer to ID whether to work this up or start higher doses of bactrim, with or without a short course of prednisone  In any case, he needs to complete the w/u and stay off cigarettes at all costs  See instructions for specific recommendations which were reviewed directly with the patient who was given a copy with highlighter outlining the key components.

## 2010-08-22 NOTE — Telephone Encounter (Signed)
Application from [employer] - Connection to Care was returned because the box regarding insurance coverage needed to be checked for "Other".  That was completed and the application was put in the mail.Paul [name]

## 2010-08-29 ENCOUNTER — Telehealth: Payer: Self-pay | Admitting: Infectious Diseases

## 2010-08-29 NOTE — Telephone Encounter (Signed)
Mr. Pouliot has been approved to receive his Lasix and Klor-Con through Rx Outreach - 90 day supply for $35.00 for both medications.  He is eligible through 08/09/2011.

## 2010-08-29 NOTE — Telephone Encounter (Signed)
Contacted [employer]/Connection to Care to check on status of application for Viagra.  It was approved and the shipment was released on 6/11 and will arrive in the office with 7 - 10 days of shipment.  Paul [name] will be called and made aware.Cammie Mcgee

## 2010-09-02 ENCOUNTER — Telehealth: Payer: Self-pay | Admitting: *Deleted

## 2010-09-02 NOTE — Telephone Encounter (Signed)
Patient notified that Patient Assistance Medication for Viagra has arrived Lot # [id number],  December 15, 2014 Paul [name] CMA

## 2010-09-04 ENCOUNTER — Telehealth: Payer: Self-pay | Admitting: Infectious Diseases

## 2010-09-04 ENCOUNTER — Ambulatory Visit: Payer: Medicare Other | Admitting: Infectious Diseases

## 2010-09-04 NOTE — Telephone Encounter (Signed)
Has not received his ADAP medications.  I told him I would call [facility] to find out what is going on.  It is arranged for his meds to be delivered but he apparently didn't get the call so the meds did not ship.  [facility] is arranging for his prescriptions to be sent to [facility] on [address] for him to pick up and the rest of his deliveries will ship to his home.Cammie Mcgee

## 2010-09-23 ENCOUNTER — Ambulatory Visit (INDEPENDENT_AMBULATORY_CARE_PROVIDER_SITE_OTHER): Payer: Medicare Other | Admitting: Infectious Diseases

## 2010-09-23 ENCOUNTER — Encounter: Payer: Self-pay | Admitting: Infectious Diseases

## 2010-09-23 DIAGNOSIS — R0602 Shortness of breath: Secondary | ICD-10-CM

## 2010-09-23 DIAGNOSIS — B20 Human immunodeficiency virus [HIV] disease: Secondary | ICD-10-CM

## 2010-09-23 DIAGNOSIS — Z21 Asymptomatic human immunodeficiency virus [HIV] infection status: Secondary | ICD-10-CM

## 2010-09-23 NOTE — Assessment & Plan Note (Signed)
Will recheck his CD4 and VL today. See him back in 3 months.

## 2010-09-23 NOTE — Progress Notes (Signed)
  Subjective:    Patient ID: SHAURYA RAWDON, male    DOB: 1955/06/27, 55 y.o.   MRN: [id number]  HPI [name] HIV+. 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  CD4 60, VL 138,000 (07-15-10). He has had persistent SOB and had CXR at last visit showing chronic stable changes.  Still having trouble with SOB, feels like he is slowly getting better. Has been taking bactrim bid. Also taking CBV/ETR/ISN. No problems with meds. Was seen by ophtho today for infection in his L eye. 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     Review of Systems     Objective:   Physical Exam  Constitutional: He appears well-developed and well-nourished.  Eyes: EOM are normal. Pupils are equal, round, and reactive to light.       He has frond like areas on his iris.   Neck: Neck supple.  Cardiovascular: Normal rate, regular rhythm and normal heart sounds.   Pulmonary/Chest: Effort normal and breath sounds normal. No respiratory distress.  Abdominal: Soft. Bowel sounds are normal. He exhibits no distension. There is no tenderness.  Lymphadenopathy:    He has no cervical adenopathy.          Assessment & Plan:

## 2010-09-23 NOTE — Assessment & Plan Note (Signed)
He feels like he is better. Hopefully this will continue to improve as he takes his ART. The etiology remains unclear.

## 2010-09-24 LAB — HIV-1 RNA QUANT-NO REFLEX-BLD: HIV-1 RNA Quant, Log: 2.36 {Log} — ABNORMAL HIGH (ref <1.30)

## 2010-09-29 ENCOUNTER — Other Ambulatory Visit: Payer: Self-pay | Admitting: Infectious Diseases

## 2010-09-29 DIAGNOSIS — B20 Human immunodeficiency virus [HIV] disease: Secondary | ICD-10-CM

## 2010-10-01 ENCOUNTER — Ambulatory Visit (INDEPENDENT_AMBULATORY_CARE_PROVIDER_SITE_OTHER)
Admission: RE | Admit: 2010-10-01 | Discharge: 2010-10-01 | Disposition: A | Discharge status: Home / Self Care | Payer: Medicare Other | Source: Ambulatory Visit | Attending: Internal Medicine | Admitting: Internal Medicine

## 2010-10-01 ENCOUNTER — Ambulatory Visit (INDEPENDENT_AMBULATORY_CARE_PROVIDER_SITE_OTHER): Payer: Medicare Other | Admitting: Internal Medicine

## 2010-10-01 ENCOUNTER — Encounter: Payer: Self-pay | Admitting: Internal Medicine

## 2010-10-01 VITALS — BP 112/80 | HR 72 | Temp 97.9°F | Ht 69.0 in | Wt 189.0 lb

## 2010-10-01 DIAGNOSIS — R0602 Shortness of breath: Secondary | ICD-10-CM

## 2010-10-01 DIAGNOSIS — R0600 Dyspnea, unspecified: Secondary | ICD-10-CM

## 2010-10-01 DIAGNOSIS — R0989 Other specified symptoms and signs involving the circulatory and respiratory systems: Secondary | ICD-10-CM

## 2010-10-01 DIAGNOSIS — R0609 Other forms of dyspnea: Secondary | ICD-10-CM

## 2010-10-01 DIAGNOSIS — A310 Pulmonary mycobacterial infection: Secondary | ICD-10-CM

## 2010-10-01 LAB — PULMONARY FUNCTION TEST

## 2010-10-01 NOTE — Patient Instructions (Signed)
Return to this clinic if you feel you are losing any ground over the next 3 months in terms of your activity tolerance, but you should gradually improve to your satisfaction by the end of 3 months, if not please return here

## 2010-10-01 NOTE — Progress Notes (Signed)
Subjective:     Patient ID: Paul [name], male   DOB: November 19, 1955, 55 y.o.   MRN: [id number]  HPI  54 yobm quit smoking around 2002 with no resp problems then around 2007 sob much worse for sev weeks/months eventually  better not sure what rx but not eval by pulmonary or on pulmonary meds then that he recalls,  then recurrent indolent onset progressive doe x sev months  while not any meds for HIV and referred to pulmonary clinic by [facility] for eval 07/2010    08/15/2010 Initial pulmonary office eval   cc sob x sev months with exertion (walking flat x 50 ft) assoc with intermittent dry cough, still not back on HIV meds due to insurance issues, with last CD4 count 60 07/15/10 but no fever on prophylactic dose of bactrim. Sleeping ok without nocturnal  or early am exac of resp c/o's or need for noct saba.  rec You need to start your medications as soon as possible because you may have a low grade form of pcp pneumonia  10/01/2010 ov/Joel [facility] cc breathing and cough better on hiv rx. Here for f/u pfts. Not really limited by sob though no longer aerobically active. No purulent or excess sputum  Pt denies any significant sore throat, dysphagia, itching, sneezing,  nasal congestion or excess/ purulent secretions,  fever, chills, sweats, unintended wt loss, pleuritic or exertional cp, hempoptysis, orthopnea pnd or leg swelling.    Also denies any obvious fluctuation of symptoms with weather or environmental changes or other aggravating or alleviating factors.         .        Objective:   Physical Exam    pleasant bm amb nad  Wt 188 08/15/2010  > 189 10/01/2010  HEENT mild turbinate edema.  Oropharynx no thrush or excess pnd or cobblestoning.  No JVD or cervical adenopathy. Mild accessory muscle hypertrophy. Trachea midline, nl thryroid. Chest was hyperinflated by percussion with diminished breath sounds and moderate increased exp time without wheeze. Hoover sign positive at mid inspiration. Regular  rate and rhythm without murmur gallop or rub or increase P2 or edema.  Abd: no hsm, nl excursion. Ext warm without cyanosis or clubbing.    cxr 07/29/10 Stable chronic lung disease with volume loss and asymmetric fibrosis on the left. No acute superimposed findings identified.  cxr 10/01/2010 No acute finding. Stable compared to prior exam.      Assessment:         Plan:

## 2010-10-01 NOTE — Progress Notes (Signed)
PFT done today. 

## 2010-10-01 NOTE — Assessment & Plan Note (Addendum)
Most likely has low grad PCP with cxr and exam not reflective  But picked up on pft's with low DLCO .  He feels better, cxr still ok so no recs other than to continue aggressive rx of HIV with CD4 still < 100

## 2010-10-07 ENCOUNTER — Other Ambulatory Visit: Payer: Self-pay | Admitting: Licensed Clinical Social Worker

## 2010-10-07 DIAGNOSIS — B20 Human immunodeficiency virus [HIV] disease: Secondary | ICD-10-CM

## 2010-10-07 MED ORDER — SULFAMETHOXAZOLE-TMP DS 800-160 MG PO TABS: 1.0000 | ORAL_TABLET | Freq: Two times a day (BID) | ORAL | Status: DC

## 2010-10-14 ENCOUNTER — Encounter: Payer: Self-pay | Admitting: Internal Medicine

## 2010-10-15 ENCOUNTER — Telehealth: Payer: Self-pay | Admitting: *Deleted

## 2010-10-15 NOTE — Telephone Encounter (Signed)
rec'd fax from [employer]'s connection to care. The viagra has been approved thru the end of 2012. For questions their # is [phone]

## 2010-11-05 ENCOUNTER — Encounter: Payer: Self-pay | Admitting: Adult Health

## 2010-11-05 ENCOUNTER — Ambulatory Visit (INDEPENDENT_AMBULATORY_CARE_PROVIDER_SITE_OTHER): Payer: Medicare Other | Admitting: Adult Health

## 2010-11-05 VITALS — BP 129/81 | HR 72 | Temp 97.6°F | Ht 69.0 in | Wt 189.0 lb

## 2010-11-05 DIAGNOSIS — H00039 Abscess of eyelid unspecified eye, unspecified eyelid: Secondary | ICD-10-CM | POA: Insufficient documentation

## 2010-11-05 DIAGNOSIS — Z79899 Other long term (current) drug therapy: Secondary | ICD-10-CM

## 2010-11-05 DIAGNOSIS — B308 Other viral conjunctivitis: Secondary | ICD-10-CM | POA: Insufficient documentation

## 2010-11-05 DIAGNOSIS — B20 Human immunodeficiency virus [HIV] disease: Secondary | ICD-10-CM

## 2010-11-05 DIAGNOSIS — B303 Acute epidemic hemorrhagic conjunctivitis (enteroviral): Secondary | ICD-10-CM | POA: Insufficient documentation

## 2010-11-05 LAB — CBC WITH DIFFERENTIAL/PLATELET
Basophils Absolute: 0 10*3/uL (ref 0.0–0.1)
HCT: 45.7 % (ref 39.0–52.0)
Lymphocytes Relative: 36 % (ref 12–46)
Neutro Abs: 1.7 10*3/uL (ref 1.7–7.7)
Platelets: 251 10*3/uL (ref 150–400)
RBC: 5.06 MIL/uL (ref 4.22–5.81)
RDW: 22.2 % — ABNORMAL HIGH (ref 11.5–15.5)
WBC: 3.8 10*3/uL — ABNORMAL LOW (ref 4.0–10.5)

## 2010-11-05 LAB — MICROALBUMIN / CREATININE URINE RATIO: Microalb Creat Ratio: 4.8 mg/g (ref 0.0–30.0)

## 2010-11-05 LAB — PHOSPHORUS: Phosphorus: 3.8 mg/dL (ref 2.3–4.6)

## 2010-11-05 NOTE — Progress Notes (Signed)
  Subjective:    Patient ID: Paul [name], male    DOB: [birth year], 55 y.o.   MRN: [id number]  HPI Paul [name] returns to clinic on request of his ophthalmologist, who has been treating him for epidemic, hemorrhagic conjunctivitis, viral conjunctivitis, eyelid cellulitis, and blurring vision. He was treated over the last few weeks with courses of topical antibiotics and antiviral medications as well as oral antibiotics. He was secondarily referred to another ophthalmologist regarding these problems, especially the ulcer to the lesion on the lower lid of the right eye. Currently, he is taking Zirgan ophthalmic gel, Viroptic 1%, bacitracin ointment, and by mouth acyclovir for the treatment of his current ocular problems. He was informed by the second ophthalmologist that the eyes appear to be healing and he is to return in one week, whereby if there still appears to be a problem, they will perform a biopsy. Current complaints to include blurring of vision, pain in the right eye, and excessive lacrimation in both eyes. He states that the first ophthalmologist wanted [place] to look at whether or not these problems were associated with his HIV or changes in his immune system.   Review of Systems  Constitutional: Negative.   HENT: Negative.   Eyes: Positive for pain, discharge, redness, itching and visual disturbance.  Respiratory: Negative.   Cardiovascular: Negative.   Gastrointestinal: Negative.   Genitourinary: Negative.   Musculoskeletal: Negative.   Skin: Negative.   Neurological: Negative.   Hematological: Negative.   Psychiatric/Behavioral: Negative.        Objective:   Physical Exam  Constitutional: He is oriented to person, place, and time. He appears well-developed and well-nourished. No distress.  HENT:  Head: Normocephalic and atraumatic.  Right Ear: External ear normal.  Left Ear: External ear normal.  Mouth/Throat: Oropharynx is clear and moist.  Eyes:    Neck: Normal  range of motion. Neck supple. No thyromegaly present.  Cardiovascular: Normal rate, regular rhythm, normal heart sounds and intact distal pulses.   Pulmonary/Chest: Effort normal and breath sounds normal.  Abdominal: Soft. Bowel sounds are normal.  Musculoskeletal: Normal range of motion.  Neurological: He is alert and oriented to person, place, and time. No cranial nerve deficit.  Skin: Skin is warm and dry.  Psychiatric: He has a normal mood and affect. His behavior is normal. Judgment and thought content normal.          Assessment & Plan:  Epidemic, hemorrhagic conjunctivitis with eyelid ulceration on the right and epiphora. While it is possible that his HIV, and very well be contributing to this, his most recent CD4 count on 09/23/2010 was 90 at 8% with a viral load of 228. 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 We'll obtain staging labs today and asked that he return in 2 weeks to review results. Meanwhile, he is instructed to continue the medications as they are currently prescribed, both by [place] and ophthalmologist, and continue to followup with the ophthalmology specialist.  He verbally acknowledged all this information and agreed with plan of care.

## 2010-11-06 LAB — COMPLETE METABOLIC PANEL WITH GFR
ALT: 18 U/L (ref 0–53)
AST: 22 U/L (ref 0–37)
Albumin: 4.5 g/dL (ref 3.5–5.2)
Calcium: 9.5 mg/dL (ref 8.4–10.5)
Chloride: 99 mEq/L (ref 96–112)
Potassium: 4.1 mEq/L (ref 3.5–5.3)
Total Protein: 9.2 g/dL — ABNORMAL HIGH (ref 6.0–8.3)

## 2010-11-06 LAB — T-HELPER CELL (CD4) - (RCID CLINIC ONLY): CD4 % Helper T Cell: 8 % — ABNORMAL LOW (ref 33–55)

## 2010-11-19 ENCOUNTER — Ambulatory Visit (INDEPENDENT_AMBULATORY_CARE_PROVIDER_SITE_OTHER): Payer: Medicare Other | Admitting: Adult Health

## 2010-11-19 ENCOUNTER — Encounter: Payer: Self-pay | Admitting: Adult Health

## 2010-11-19 DIAGNOSIS — H00019 Hordeolum externum unspecified eye, unspecified eyelid: Secondary | ICD-10-CM

## 2010-11-19 DIAGNOSIS — H01019 Ulcerative blepharitis unspecified eye, unspecified eyelid: Secondary | ICD-10-CM

## 2010-11-19 DIAGNOSIS — B20 Human immunodeficiency virus [HIV] disease: Secondary | ICD-10-CM

## 2010-11-19 DIAGNOSIS — B303 Acute epidemic hemorrhagic conjunctivitis (enteroviral): Secondary | ICD-10-CM

## 2010-11-21 ENCOUNTER — Ambulatory Visit: Payer: Medicare Other

## 2010-11-25 ENCOUNTER — Ambulatory Visit: Payer: Medicare Other

## 2010-11-26 ENCOUNTER — Telehealth: Payer: Self-pay | Admitting: *Deleted

## 2010-11-26 DIAGNOSIS — H01019 Ulcerative blepharitis unspecified eye, unspecified eyelid: Secondary | ICD-10-CM | POA: Insufficient documentation

## 2010-11-26 DIAGNOSIS — H00019 Hordeolum externum unspecified eye, unspecified eyelid: Secondary | ICD-10-CM | POA: Insufficient documentation

## 2010-11-26 NOTE — Telephone Encounter (Signed)
Fine to put in clinic with me.

## 2010-11-26 NOTE — Progress Notes (Signed)
  Subjective:    Patient ID: Paul [name], male    DOB: 11/02/1955, 55 y.o.   MRN: [id number]  HPI Paul [name] returns to clinic for followup after repeat staging labs and evaluation by [facility] Ophthalmology for his angular blepharitis, and ulcerative lesions of the right eye, and progressing on the left. He states a biopsy was performed, but he does not know the results of this. States that since last seen his eye lesions have not improved. Denies blurring of vision or any visual disturbances, except excessive tearing.   Review of Systems  Eyes: Positive for discharge, redness and itching. Negative for photophobia, pain and visual disturbance.       As per history of present illness       Objective:   Physical Exam  Constitutional: He appears well-developed.  Eyes: EOM are normal. Pupils are equal, round, and reactive to light. Right eye exhibits discharge. Left eye exhibits discharge.       Ulcerative lesion to the right lower conjunctival area, unchanged from previous. Assessment 2 weeks ago. There still remains some redness in both conjunctivae and excessive tearing.          Assessment & Plan:  1. External Hordeolum, and Ulcerative Angular Blepharitis. We were able to obtain records from [facility] regarding his evaluations. Pathology report from tissue biopsy as of 11/19/2010, showed suppurative dermatitis, which may represent a chalazion. We recommend at this point to continue his followup with the outpatient eye clinic at [facility] and ielectn discussion with Dr. Ninetta Lights, we will obtain serum isolator tube for AFB. We might also want to consider obtaining a QuantiFERON Gold to see if there is also an identification of active tuberculosis. We'll discuss this with Dr. Ninetta Lights next week.  2. HIV. Labs obtained 11/05/2010. Show a CD4 count of 110 at 8% with a viral load of 28. Copies per mL. CD4 count percents remaining consistencies. Labs  that were obtained in July 2012, and show minimal, absolute value elevation. However, his viral load did drop by another factor of one long, which is showing relative response from the start of his therapy. Recommend continuing present management, repeating labs in 10 weeks for followup with Dr. Ninetta Lights in 3 months for his HIV. However, considering the issues. He is having relative to problem #1, followup might need to be more frequent. We will recommend that he sees Dr. Ninetta Lights. Again on his next available appointment in October 2012.

## 2010-11-26 NOTE — Telephone Encounter (Signed)
Dr. Orson Slick from [facility] saw patient with referral from this clinic regarding eyelid lesions.  Cultures and biopsies were all negative.  He is a patient of Dr. Ninetta Lights, but last 2 visits he was seen by Traci Sermon, NP.  Dr. Orson Slick feels he needs to be treated from an infectious disease standpoint on this issue and is requesting a call from an MD.  Will speak to Dr. Daiva Eves about this today.  His secretary is Linda Hedges [phone]. Wendall Mola

## 2010-12-04 ENCOUNTER — Ambulatory Visit (INDEPENDENT_AMBULATORY_CARE_PROVIDER_SITE_OTHER): Payer: Medicare Other | Admitting: Infectious Disease

## 2010-12-04 ENCOUNTER — Encounter: Payer: Self-pay | Admitting: Infectious Disease

## 2010-12-04 VITALS — BP 115/82 | HR 74 | Temp 97.7°F | Wt 192.0 lb

## 2010-12-04 DIAGNOSIS — B20 Human immunodeficiency virus [HIV] disease: Secondary | ICD-10-CM

## 2010-12-04 DIAGNOSIS — H01019 Ulcerative blepharitis unspecified eye, unspecified eyelid: Secondary | ICD-10-CM

## 2010-12-04 DIAGNOSIS — H00039 Abscess of eyelid unspecified eye, unspecified eyelid: Secondary | ICD-10-CM

## 2010-12-04 DIAGNOSIS — B308 Other viral conjunctivitis: Secondary | ICD-10-CM

## 2010-12-04 DIAGNOSIS — Z23 Encounter for immunization: Secondary | ICD-10-CM

## 2010-12-04 DIAGNOSIS — H00019 Hordeolum externum unspecified eye, unspecified eyelid: Secondary | ICD-10-CM

## 2010-12-04 MED ORDER — ETRAVIRINE 200 MG PO TABS: 200.0000 mg | ORAL_TABLET | Freq: Two times a day (BID) | ORAL | Status: DC

## 2010-12-04 NOTE — Assessment & Plan Note (Signed)
Not clear at all what is causing this and it is failing anti MSSA and MRSA antibiotics. It is not clearly fluctuant, but I do wonder if he might not need a surgery to better a) control this and b) get better specimen for biopsy, pathology and cultures for AFB, fungi, and bacteria. I will check AFB blood culture, fungal cutlure from blood and check quanitferon gold, urine histo ag, urine blasto ag. Again I am considering imaging and also referral to another surgeon if [facility] doctors and I agree that he needs more aggressive biopsy, surgery and it is not in their particular arena.

## 2010-12-04 NOTE — Assessment & Plan Note (Signed)
See above discussion

## 2010-12-04 NOTE — Assessment & Plan Note (Signed)
Note he is on topical gancyclovir though I found no clear proof he has CMV infection. He certainly is at risk given how low his cd4 has been

## 2010-12-04 NOTE — Assessment & Plan Note (Signed)
Continue current ARV regimen. I will change his etravirine to the 200mg  tablets

## 2010-12-04 NOTE — Progress Notes (Signed)
Subjective:    Patient ID: Paul [name], male    DOB: 07/09/1955, 55 y.o.   MRN: [id number]  HPI Mr. 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 He was also found to have pseudomembranes involving the right inferior fornix and the bulbar conjunctiva. He had been treated with various oral antibiotics as well as topical agents by Dr. Karleen Hampshire and then seen by Dr. Carilyn Goodpasture and Dr. Orson Slick at [facility] . 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 This was later changed to doxycline and he has had essentially no improvement in these lesions. He is also taking topical gancyclovir. He had biopsy done in late August that showed suppurative dermatitis. He underwent another biopsy last week, hopefully with bacterial, fungal and AFB cultures. He returns today for followup. I am currently endeavoring to get conversation with Dr. Carilyn Goodpasture and colleagues. If the biopsy was sent for appropriate cultures but we do not have a definitive diagnosis I would like to move to treat him empirically for Mycobacterial infection of skin. (ie with consideration of MTB, Mavium, or rapid growers such as M fortuitum, M chelonae, M. absacessus or a dimorphic fungal infection. I also think we need to consider imaging him with CT maxillofacial. I spent greater than 45 minutes with this patient including 50% of  time spent in coordinating care and in face to face counselling of the patient [phone]  Review of Systems  Constitutional: Negative for fever, chills, diaphoresis, activity change, appetite change, fatigue and unexpected weight change.  HENT: Negative for congestion, sore throat, rhinorrhea, sneezing, trouble swallowing and sinus pressure.   Eyes: Positive for discharge,  redness and visual disturbance. Negative for photophobia.  Respiratory: Negative for cough, chest tightness, shortness of breath, wheezing and stridor.   Cardiovascular: Negative for chest pain, palpitations and leg swelling.  Gastrointestinal: Negative for nausea, vomiting, abdominal pain, diarrhea, constipation, blood in stool, abdominal distention and anal bleeding.  Genitourinary: Negative for dysuria, hematuria, flank pain and difficulty urinating.  Musculoskeletal: Negative for myalgias, back pain, joint swelling, arthralgias and gait problem.  Skin: Positive for color change. Negative for pallor, rash and wound.  Neurological: Negative for dizziness, tremors, weakness and light-headedness.  Hematological: Negative for adenopathy. Does not bruise/bleed easily.  Psychiatric/Behavioral: Negative for behavioral problems, confusion, sleep disturbance, dysphoric mood, decreased concentration and agitation.       Objective:   Physical Exam  Constitutional: He is oriented to person, place, and time. He appears well-developed and well-nourished. No distress.  HENT:  Head: Normocephalic and atraumatic.  Mouth/Throat: Oropharynx is clear and moist. No oropharyngeal exudate.  Eyes: EOM are normal. Right eye exhibits discharge. Left eye exhibits discharge. No scleral icterus.    Neck: Normal range of motion. Neck supple. No JVD present.  Cardiovascular: Normal rate, regular rhythm and normal heart sounds.  Exam reveals no gallop and no friction rub.   No murmur heard. Pulmonary/Chest: Effort normal and breath sounds normal. No respiratory distress. He has no wheezes. He has no rales. He exhibits no tenderness.  Abdominal: He exhibits no distension and no mass. There is no tenderness. There is no rebound and no guarding.  Musculoskeletal: He exhibits no edema and no tenderness.  Lymphadenopathy:    He has no cervical adenopathy.  Neurological:  He is alert and oriented to person, place, and  time. He has normal reflexes. He exhibits normal muscle tone. Coordination normal.  Skin: Skin is warm and dry. He is not diaphoretic. No erythema. No pallor.  Psychiatric: He has a normal mood and affect. His behavior is normal. Judgment and thought content normal.          Assessment & Plan:

## 2010-12-09 ENCOUNTER — Telehealth: Payer: Self-pay | Admitting: Infectious Disease

## 2010-12-09 DIAGNOSIS — R600 Localized edema: Secondary | ICD-10-CM

## 2010-12-09 NOTE — Telephone Encounter (Signed)
I reviewed this case with Dr.'s Juliann Mule, Orvan Falconer and [place]. I am worried this patient may have periorbital lymphoma. I would like to get a CT of maxillofacial and neck to assess this area. I think the patient is going to need another biopsy too. Can we try to get this CT MFacial scheduled this week? Preferably next day or so

## 2010-12-10 NOTE — Telephone Encounter (Signed)
This patient is scheduled for Thursday 9/27 at 1:15.  I called the patient to let him know.

## 2010-12-10 NOTE — Telephone Encounter (Signed)
Thanks tamika

## 2010-12-11 LAB — FUNGUS CULTURE, BLOOD: Organism ID, Bacteria: NO GROWTH

## 2010-12-12 ENCOUNTER — Ambulatory Visit (HOSPITAL_COMMUNITY)
Admission: RE | Admit: 2010-12-12 | Discharge: 2010-12-12 | Disposition: A | Discharge status: Home / Self Care | Payer: Medicare Other | Source: Ambulatory Visit | Attending: Infectious Disease | Admitting: Infectious Disease

## 2010-12-12 DIAGNOSIS — R599 Enlarged lymph nodes, unspecified: Secondary | ICD-10-CM | POA: Insufficient documentation

## 2010-12-12 DIAGNOSIS — R600 Localized edema: Secondary | ICD-10-CM

## 2010-12-12 DIAGNOSIS — H5789 Other specified disorders of eye and adnexa: Secondary | ICD-10-CM | POA: Insufficient documentation

## 2010-12-12 MED ORDER — IOHEXOL 300 MG/ML  SOLN
100.0000 mL | Freq: Once | INTRAMUSCULAR | Status: AC | PRN
  Administered 2010-12-12: 100 mL via INTRAVENOUS

## 2010-12-13 ENCOUNTER — Telehealth: Payer: Self-pay | Admitting: *Deleted

## 2010-12-13 NOTE — Telephone Encounter (Signed)
Patient called wanting to get the results of his recent CT. Advised him the results are in however I am not qualified to tell him exactly what they mean. Due to this I would forward his provider a note to view the CT results and give me the information to tell him and we would give him a call asap. Patient was ok with this course of action.  PLEASE ADVISE.

## 2010-12-16 ENCOUNTER — Telehealth: Payer: Self-pay | Admitting: *Deleted

## 2010-12-16 LAB — T-HELPER CELL (CD4) - (RCID CLINIC ONLY): CD4 T Cell Abs: 10 — ABNORMAL LOW

## 2010-12-16 NOTE — Telephone Encounter (Signed)
Patient called and asked about an xray from radiology that he was to pick up for another doctor visit. Checked with the patients provider to see if the CD was in the building and it is. Advised patient he can pick up at his earliest convenience. Patient advised he will pick up today.

## 2010-12-16 NOTE — Telephone Encounter (Signed)
I called him. He needs to come by our office to pick up a copy of his CD rom that I will get from radiology for his biopsy

## 2010-12-18 ENCOUNTER — Ambulatory Visit: Payer: Medicare Other | Admitting: Infectious Disease

## 2010-12-19 ENCOUNTER — Telehealth: Payer: Self-pay | Admitting: Infectious Disease

## 2010-12-19 LAB — HIV-1 RNA QUANT-NO REFLEX-BLD
HIV 1 RNA Quant: 68600 copies/mL — ABNORMAL HIGH (ref <48)
HIV-1 RNA Quant, Log: 4.84 {Log} — ABNORMAL HIGH (ref <1.68)

## 2010-12-19 LAB — CULTURE, ROUTINE-ABSCESS: Gram Stain: NONE SEEN

## 2010-12-19 LAB — HISTOPLASMA ANTIGEN, URINE

## 2010-12-19 NOTE — Telephone Encounter (Signed)
Patient called to see if we were still trying to get him a room at the hospital. Advised him we are and that they will call him directly when they have one it may take some time.

## 2010-12-19 NOTE — Telephone Encounter (Signed)
[name] I am trying to get ahold of Mr Matheny to admit him to the B service with Dr. Orvan Falconer. Maybe we can go ahead and request a regular bed for him for now. I called him just now and will try again later

## 2010-12-20 ENCOUNTER — Inpatient Hospital Stay (HOSPITAL_COMMUNITY)
Admission: AD | Admit: 2010-12-20 | Discharge: 2010-12-22 | DRG: 124 | Disposition: A | Discharge status: Home / Self Care | Payer: Medicare Other | Source: Ambulatory Visit | Attending: Infectious Diseases | Admitting: Infectious Diseases

## 2010-12-20 ENCOUNTER — Encounter: Payer: Self-pay | Admitting: Internal Medicine

## 2010-12-20 DIAGNOSIS — H00039 Abscess of eyelid unspecified eye, unspecified eyelid: Secondary | ICD-10-CM | POA: Diagnosis present

## 2010-12-20 DIAGNOSIS — B20 Human immunodeficiency virus [HIV] disease: Secondary | ICD-10-CM

## 2010-12-20 DIAGNOSIS — H019 Unspecified inflammation of eyelid: Secondary | ICD-10-CM

## 2010-12-20 DIAGNOSIS — F17200 Nicotine dependence, unspecified, uncomplicated: Secondary | ICD-10-CM | POA: Diagnosis present

## 2010-12-20 DIAGNOSIS — H01019 Ulcerative blepharitis unspecified eye, unspecified eyelid: Principal | ICD-10-CM | POA: Diagnosis present

## 2010-12-20 DIAGNOSIS — K219 Gastro-esophageal reflux disease without esophagitis: Secondary | ICD-10-CM | POA: Diagnosis present

## 2010-12-20 LAB — BASIC METABOLIC PANEL
BUN: 11 mg/dL (ref 6–23)
Calcium: 9.5 mg/dL (ref 8.4–10.5)
GFR calc Af Amer: 81 mL/min — ABNORMAL LOW (ref >90)
GFR calc non Af Amer: 70 mL/min — ABNORMAL LOW (ref >90)
Potassium: 4.3 mEq/L (ref 3.5–5.1)

## 2010-12-20 NOTE — H&P (Signed)
Hospital Admission Note Date: 12/20/2010  Patient name: Paul [name] Medical record number: [id number] Date of birth: Jan 30, 1956 Age: 55 y.o. Gender: male PCP: [name], MD, MD  Medical Service: B 2  Attending physician:  Dr. Maurice March  Pager: Resident (R2/R3):  Dr. Dorthula Rue  Pager: [phone] Resident (R1):  Dr. Dorise Hiss  Pager: [phone]  Chief Complaint: eyelid swelling  History of Present Illness: This gentleman is a 55 YO man with known history of HIV with last known CD4 count of 110 in 11/05/10. He has had history of AIDs defining illness in the past. He lost his insurance and stopped taking his HAART therapy. He restarted therapy several months ago. A month or two after restarting his medications he developed swelling in the lower eyelid of his R eye. Over the next several weeks it worsened and started in the L lower eyelid as well. He was treated with topical and oral antibiotics and has been on oral and topical anti-virals over that time period. He was sent to an ophthalmologist and treated unsuccessfully. He has had several biopsies and cultures taken in that time period. He was sent to Dr. Ophelia Charter in [facility] for additional insight and treatment. At that time a culture was taken that was negative. However, recently another culture was obtained that may have been positive for EBV. He has not improved on any therapy thus far. He has associated swelling, watering of the eye with slight purulent yellow discharge. NO bloody discharge from the eye. No change in vision, no headaches, no increase in sinus pressure or drainage. No pain, some itching in the area. No chest pain, no SOB, no diarrhea, no constipation, no dysuria, no swelling or abdominal pain. No change in hearing or tinnitus. No change in sensation or feeling on the face. No change in taste or smell. No whiteness on the tongue or ulcerations in the mouth. Dr. Daiva Eves thought that perhaps the timing may have coincided with a  response in his immune system. He thought that the patient would benefit from an admission for some steroid administration to try to decrease the immune response.  Meds: Etravirine 200 mg BID Zirgan 0.15% gel opthalmic Acyclovir 400 mg TID Fe Sulfate 325 mg daily Combivir (lamivudine/zidovudine) 150/300 mg BID Bactrim DS BID     Allergies: Banana  Past Medical History  Diagnosis Date  . HIV (human immunodeficiency virus infection) Last CD4 110 11/05/10   No past surgical history on file. No family history on file.  History   Social History  . Marital Status: Single    Spouse Name: N/A    Number of Children: N/A  . Years of Education: N/A   Occupational History  . Retail sales    Social History Main Topics  . Smoking status: Former Smoker -- 0.5 packs/day for 10 years    Types: Cigarettes    Quit date: 03/17/2000  . Smokeless tobacco: Never Used  . Alcohol Use: 1.0 oz/week    2 drink(s) per week     seldom  . Drug Use: No  . Sexually Active: Yes     pt. declined condoms   Other Topics Concern  . Not on file   Social History Narrative  . No narrative on file    Review of Systems: Pertinent items are noted in HPI.  Physical Exam: Vitals: T: 97.8  HR:  73 BP: 109/70  RR: 20  O2 saturation: 96% RA General: resting in bed HEENT: PERRL, EOMI, no scleral  icterus, R lower eyelid swelling with area of central ulceration (approx 0.5 mm) and some purulent drainage around the lateral edge of the eye. The swelling extends down approx 3 cm. L lower eyelid has some swelling however much reduced compare to the R. The upper eyelid of the L eye is also somewhat involved medially.  Cardiac: RRR, no rubs, murmurs or gallops Pulm: clear to auscultation bilaterally, moving normal volumes of air Abd: soft, nontender, nondistended, BS present Ext: warm and well perfused, no pedal edema Neuro: alert and oriented X3, cranial nerves II-XII grossly intact, strength and sensation to  light touch equal in bilateral upper and lower extremities, vision intact, visual fields grossly intact.    Lab results: CD4 count 110 from 11/05/10  Assessment & Plan by Problem: 1. Eyelid swelling - blepharitis vs hordeolum vs increased systemic immune response. R greater than L. As not unilateral malignancy is less consideration however a more extensive bx may need to be taken in the future if no improvement is made or worsening occurs. Started solumedrol IV 60 mg daily to help with immune response, Acyclovir IV dosed per pharmacy for possible EBV infection. May consider warm compresses to the eyes. Get records regarding the exact biopsies and cultures that were sent and the results. Recent biopsy from [facility] indicates suppurative dermatitis that may represent blepharitis consider clinical correlation. Coordinate with opthalmology at [facility]. May consider topical anti-bacterials or anti-virals for the area. Pt states he was recently on oral anti-biotic however these findings could not be verified in epic. Consider quantitative EBV serum by PCR.  2. HIV with previous AIDs defining illness - kept pt on regimen for HIV including combivir, raltegravir, etravirine. Last CD4 110 in 11/05/10. Will continue bactrim DS BID dosing for ppx. 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 Histoplasminogen and cryptococcal antigens were negative recently. Quantiferon gold also recently negative.  3. DVT ppx - lovenox 40 mg [place] daily   R2/3______________________________      R1________________________________  ATTENDING: I performed and/or observed a history and physical examination of the patient.  I discussed the case with the residents as noted and reviewed the residents' notes.  I agree with the findings and plan--please refer to the attending physician note for more details.  Signature________________________________  Printed Name_____________________________

## 2010-12-21 LAB — CBC
Hemoglobin: 14.7 g/dL (ref 13.0–17.0)
MCHC: 35.9 g/dL (ref 30.0–36.0)
Platelets: 241 10*3/uL (ref 150–400)
RDW: 19.6 % — ABNORMAL HIGH (ref 11.5–15.5)

## 2010-12-21 LAB — COMPREHENSIVE METABOLIC PANEL
ALT: 17 U/L (ref 0–53)
AST: 20 U/L (ref 0–37)
Albumin: 3.3 g/dL — ABNORMAL LOW (ref 3.5–5.2)
Alkaline Phosphatase: 93 U/L (ref 39–117)
Potassium: 4.5 mEq/L (ref 3.5–5.1)
Sodium: 135 mEq/L (ref 135–145)
Total Protein: 8.1 g/dL (ref 6.0–8.3)

## 2010-12-24 ENCOUNTER — Encounter: Payer: Self-pay | Admitting: Infectious Disease

## 2010-12-24 ENCOUNTER — Ambulatory Visit (INDEPENDENT_AMBULATORY_CARE_PROVIDER_SITE_OTHER): Payer: Medicare Other | Admitting: Infectious Disease

## 2010-12-24 VITALS — BP 129/90 | HR 88 | Temp 97.4°F | Ht 69.0 in | Wt 199.0 lb

## 2010-12-24 DIAGNOSIS — B029 Zoster without complications: Secondary | ICD-10-CM

## 2010-12-24 DIAGNOSIS — B20 Human immunodeficiency virus [HIV] disease: Secondary | ICD-10-CM

## 2010-12-24 DIAGNOSIS — R6510 Systemic inflammatory response syndrome (SIRS) of non-infectious origin without acute organ dysfunction: Secondary | ICD-10-CM

## 2010-12-24 DIAGNOSIS — D893 Immune reconstitution syndrome: Secondary | ICD-10-CM | POA: Insufficient documentation

## 2010-12-24 DIAGNOSIS — B009 Herpesviral infection, unspecified: Secondary | ICD-10-CM | POA: Insufficient documentation

## 2010-12-24 DIAGNOSIS — B0052 Herpesviral keratitis: Secondary | ICD-10-CM

## 2010-12-24 DIAGNOSIS — H00039 Abscess of eyelid unspecified eye, unspecified eyelid: Secondary | ICD-10-CM

## 2010-12-24 MED ORDER — VALACYCLOVIR HCL 1 G PO TABS: 1000.0000 mg | ORAL_TABLET | Freq: Three times a day (TID) | ORAL | Status: DC

## 2010-12-24 MED ORDER — PREDNISONE 20 MG PO TABS: 60.0000 mg | ORAL_TABLET | Freq: Every day | ORAL | Status: AC

## 2010-12-24 NOTE — Assessment & Plan Note (Signed)
Again appears to likley be IRIS clinically

## 2010-12-24 NOTE — Assessment & Plan Note (Signed)
Continue valtrex and topical antivirals, topica antibacterials per Dr. Karleen Hampshire

## 2010-12-24 NOTE — Progress Notes (Signed)
Subjective:    Patient ID: Paul [name], male    DOB: 1956/01/24, 55 y.o.   MRN: [id number]  HPI  Paul [name] is a 55 year old man with HIV and AIDS who has developed a ulcerative granulating lesion on his right lower eyelid as well as the medial canthus and the contralateral left side. He was also found to have pseudomembranes involving the right inferior fornix and the bulbar conjunctiva. He had been treated with various oral antibiotics as well as topical agents by Dr. Karleen Hampshire and then seen by Dr. Carilyn Goodpasture and Dr. Orson Slick at [facility] . 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 This was later changed to doxycline and he has had essentially no improvement in these lesions. He is also  Has been taking topical gancyclovir. He had biopsy done in late August that showed suppurative dermatitis, repeat biopsy in September that showed infiltrate of plasma cells a small minority of which showed CMV positivity. There was no evidence of lymphoma. We had also obtained a CT of the head that had showed extension of the edema in the right eyelid into the orbit, left eyelid lesion did not extend into the orbit. Last week he underwent 3rd bipsy this time with cultures and pCRs obtained in addition to path. We admitted [facility] to [facility] for IV acyclovir and oral prednisone this past Saturday due to our concerns that this represented an IRIS to HSV infection (he had been having flares of hsv2 keratitis for many years rx by Dr. Karleen Hampshire with stripping of membranes and rx with valtrex). He had by then already improved with diminution of the eyelid lesions slightly. HE was given IV solumedrol and iv acylclovir now change to oral prednisone and valtrex. He returns to clinic for followup and is continuing to feel better. Biopsy was negative for AFB, fungi. Bacterial culture grew a Coag neg staph and a  propionobacterium (likely contaminants). CMV PCR negative. 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    Review of Systems  Constitutional: Negative for fever, chills, diaphoresis, activity change, appetite change, fatigue and unexpected weight change.  HENT: Negative for congestion, sore throat, rhinorrhea, sneezing, trouble swallowing and sinus pressure.   Eyes: Negative for photophobia and visual disturbance.  Respiratory: Negative for cough, chest tightness, shortness of breath, wheezing and stridor.   Cardiovascular: Negative for chest pain, palpitations and leg swelling.  Gastrointestinal: Negative for nausea, vomiting, abdominal pain, diarrhea, constipation, blood in stool, abdominal distention and anal bleeding.  Genitourinary: Negative for dysuria, hematuria, flank pain and difficulty urinating.  Musculoskeletal: Negative for myalgias, back pain, joint swelling, arthralgias and gait problem.  Skin: Negative for color change, pallor, rash and wound.  Neurological: Negative for dizziness, tremors, weakness and light-headedness.  Hematological: Negative for adenopathy. Does not bruise/bleed easily.  Psychiatric/Behavioral: Negative for behavioral problems, confusion, sleep disturbance, dysphoric mood, decreased concentration and agitation.       Objective:   Physical Exam  Constitutional: He is oriented to person, place, and time. He appears well-developed and well-nourished. No distress.  HENT:  Head: Atraumatic.  Mouth/Throat: Oropharynx is clear and moist. No oropharyngeal exudate.  Eyes: EOM are normal. Right eye exhibits chemosis. No scleral icterus.    Neck: Normal range of motion. Neck supple. No JVD present.  Cardiovascular: Normal rate, regular rhythm and normal heart sounds.  Exam reveals no gallop and no friction rub.   No murmur heard. Pulmonary/Chest: Effort  normal and breath sounds normal. No respiratory distress. He has no wheezes. He has no rales. He exhibits no  tenderness.  Abdominal: He exhibits no distension and no mass. There is no tenderness. There is no rebound and no guarding.  Musculoskeletal: He exhibits no edema and no tenderness.  Lymphadenopathy:    He has no cervical adenopathy.  Neurological: He is alert and oriented to person, place, and time. He has normal reflexes. He exhibits normal muscle tone. Coordination normal.  Skin: Skin is warm and dry. He is not diaphoretic. No erythema. No pallor.  Psychiatric: He has a normal mood and affect. His behavior is normal. Judgment and thought content normal.          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 Karleen Hampshire  Eyelid cellulitis Again appears to likley be IRIS clinically  HIV DISEASE Repeat CD4 and viral load pending

## 2010-12-24 NOTE — Assessment & Plan Note (Signed)
Repeat CD4 and viral load pending

## 2010-12-24 NOTE — Assessment & Plan Note (Addendum)
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 WIl continue high dose steroids and also oral valtrex

## 2010-12-24 NOTE — Assessment & Plan Note (Signed)
Continue valtrex  

## 2010-12-28 ENCOUNTER — Other Ambulatory Visit: Payer: Self-pay | Admitting: Infectious Diseases

## 2011-01-02 LAB — AFB CULTURE, BLOOD

## 2011-01-07 ENCOUNTER — Ambulatory Visit: Payer: Medicare Other | Admitting: Infectious Disease

## 2011-01-13 NOTE — Discharge Summary (Signed)
Paul, [name] NO.:  [id number]  MEDICAL RECORD NO.:  [id number]  LOCATION:  3033                         FACILITY:  [facility]  PHYSICIAN:  Cliffton Asters, M.D.    DATE OF BIRTH:  03/15/1956  DATE OF ADMISSION:  12/20/2010 DATE OF DISCHARGE:  12/22/2010                              DISCHARGE SUMMARY   DISCHARGE DIAGNOSES: 1. 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 2. 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     a.     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 3. Tobacco abuse 4. Gastroesophageal reflux disease.  DISCHARGE MEDICATIONS WITH ACCURATE DOSES: 1. Prednisone 20 mg, take 3 tabs by mouth for 3 days, 2 tabs by mouth     for 3 days, 1 tab by mouth for 3 days, 1/2 tab by mouth for 3 days,     and then stop. 2. Tobrex ophthalmic ointment, instill 2 drops over 1/4th inch strip     every night. 3. Acyclovir 400 mg, take 1 tablet by mouth 3 times a day. 4. Combivir 300/150 mg, take 1 tablet by mouth twice daily. 5. Intelence or etravirine 100 mg, take 2 tabs by mouth twice daily. 6. Isentress or raltegravir 400 mg, take 1 tablet by mouth twice     daily. 7. Multivitamins, take 1 tablet by mouth daily. 8. Bactrim double strength 800/160 mg, take 1 tablet by mouth twice     daily. 9. Zirgan or ganciclovir ophthalmic gel, apply to both eyes daily at     bedtime.  DISPOSITION AND FOLLOWUP:  The patient was discharged home in stable and improved condition from [facility] on December 22, 2010.  The patient has been scheduled a followup appointment with Dr. Da[facility] on December 24, 2010.  At that time, his periorbital cellulitis and ulceration needs to be evaluated.  The patient should be counseled on medication adherence and  compliance.  We also need to make sure that the patient continues to take his tapering doses of steroids and acyclovir.  PROCEDURES PERFORMED:  None.  CONSULTATIONS:  Tyrone Apple. Karleen Hampshire, MD with Ophthalmology for periorbital cellulitis.  ADMITTING HISTORY AND PHYSICAL AND HISTORY OF PRESENT ILLNESS:  A 55- year-old gentleman with known history of HIV and had AIDS defining illness in the past, history of medications noncompliance, presents to the ED with eyelid swelling, right more than left.  He started therapy several months ago and after a month or 2 of starting the medication, he developed swelling in the lower eyelid of his right eye.  Over next several weeks, it worsened and started in the lower eyelid as well.  He was treated with topical and oral antibiotics and has been on oral and topical antivirals over that period of time.  He was sent to ophthalmologist and treated unsuccessfully.  He has had 3 biopsies during this time period and the last biopsy was sent to Dr. Ophelia Charter at [facility]  for additional insight and treatment.  At that time, some of the cultures for histoplasma, TVA, and fungal cultures were also taken.  His recent biopsy showed some lymphocytic infiltration with EBV and HSV.  He has not improved with any therapy so for and was admitted for IV steroids and treatment with acyclovir.  He also reports associated swelling, watering of the eye with slightly purulent yellow discharge.  No bloody discharge from the eye.  He also denies any change in the vision, headaches, sinus pressure, or drainage.  He also denies any chest pain, shortness of breath, alteration in bladder or bowel movements.  PHYSICAL EXAMINATION:  VITAL SIGNS:  On admission, temperature 97.8, heart rate 72, blood pressure 109/70, respiratory rate 20, O2 sats 96% on room air. GENERAL:  Alert and oriented x3, resting comfortably in the bed. HEENT:  Pupils equal, round, and reactive to light.   Extraocular movements intact.  Right lower eyelid swelling with area of central ulceration approximately 0.5 mm and some purulent drainage around the lateral edge of the eye.  The swelling extends down approximately 3 cm. Left lower eyelid has some swelling, however, much reduced as compared to the right.  Upper eyelid of the left eye is also somewhat involved medially. CARDIAC:  Regular rate and rhythm.  No murmurs, rubs, or gallops. LUNGS:  Clear to auscultation bilaterally.  No wheezes, rales, or rhonchi. ABDOMEN:  Soft, nontender, nondistended.  Bowel sounds present. EXTREMITIES:  Warm and well perfused, no fetal edema. NEUROLOGIC:  Alert and oriented x3.  Cranial nerves II-XII intact. Strength and sensation intact to bilateral touch and pinprick.  Motor strength 5/5 bilaterally in all 4 extremities, vision intact, visual fields grossly intact.  HOSPITAL COURSE BY PROBLEM: 1. Eyelid swelling with periorbital cellulitis.  The patient presented     with right more than left periorbital edema and left lower eyelid     ulcerations that has been present for about last 1-2 months.  The     patient has a history of poor medical compliance but states that     for the last 1-2 months, he has been adherent to his medications.     He underwent 3 biopsies of his eye over a period of time which have     been largely unrevealing with the last biopsy showing some     lymphocytic infiltrate and a few EBV positive cells with some     suspicion for herpes simplex virus.  Differentials for his     presentation include immune reconstitution inflammatory     syndrome( IRIS) secondary to HSV keratitis versus lymphoma versus     blepharitis.  The patient reports that his eyelid swelling and     ulceration have started improving which is coincident with starting     of acyclovir about a month ago.  We think this is most likely IRIS     secondary to HSV keratitis that is improving with time.   Initially,     the patient was started on IV steroids and IV acyclovir.     Ophthalmology consult was obtained and Dr. Karleen Hampshire recommended that he     continues with Zirgan eye drops and he also added Tobrex ointment     with a followup scheduled as an outpatient basis.  In context of     his improving ulceration and swelling, the patient was discharged     home with instructions to continue his antiretroviral therapy and     acyclovir  with a followup scheduled at [facility]patient Clinic. 2. GERD: stable.  He denied any symptoms during this admission.  DISCHARGE LABS AND VITALS:  His discharge vitals include temperature 97.7, pulse 69, respiratory rate 16, blood pressure 124/80, O2 sats 95% on room air.  His discharge labs include white cell count 4.8, hemoglobin 14.7, hematocrit 41, platelets 241.  Sodium 135, potassium 4.5, chloride 104, bicarb 22, glucose 125, BUN 10, creatinine 1.04.    ______________________________ Elyse Jarvis, MD   ______________________________ Cliffton Asters, M.D.    MS/MEDQ  D:  12/26/2010  T:  12/26/2010  Job:  [id number]  cc:   Lacretia Leigh. Ninetta Lights, M.D.  Electronically Signed by Elyse Jarvis MD on 01/01/2011 06:12:57 PM Electronically Signed by Cliffton Asters M.D. on 01/13/2011 03:18:48 PM

## 2011-02-05 ENCOUNTER — Encounter: Payer: Self-pay | Admitting: Infectious Disease

## 2011-02-05 ENCOUNTER — Ambulatory Visit (INDEPENDENT_AMBULATORY_CARE_PROVIDER_SITE_OTHER): Payer: Medicare Other | Admitting: Infectious Disease

## 2011-02-05 ENCOUNTER — Ambulatory Visit
Admission: RE | Admit: 2011-02-05 | Discharge: 2011-02-05 | Disposition: A | Discharge status: Home / Self Care | Payer: Medicare Other | Source: Ambulatory Visit | Attending: Infectious Disease | Admitting: Infectious Disease

## 2011-02-05 DIAGNOSIS — B0052 Herpesviral keratitis: Secondary | ICD-10-CM

## 2011-02-05 DIAGNOSIS — R0602 Shortness of breath: Secondary | ICD-10-CM

## 2011-02-05 DIAGNOSIS — B029 Zoster without complications: Secondary | ICD-10-CM

## 2011-02-05 DIAGNOSIS — H05019 Cellulitis of unspecified orbit: Secondary | ICD-10-CM

## 2011-02-05 DIAGNOSIS — D893 Immune reconstitution syndrome: Secondary | ICD-10-CM

## 2011-02-05 DIAGNOSIS — R0609 Other forms of dyspnea: Secondary | ICD-10-CM

## 2011-02-05 DIAGNOSIS — B009 Herpesviral infection, unspecified: Secondary | ICD-10-CM

## 2011-02-05 DIAGNOSIS — R6510 Systemic inflammatory response syndrome (SIRS) of non-infectious origin without acute organ dysfunction: Secondary | ICD-10-CM

## 2011-02-05 DIAGNOSIS — B20 Human immunodeficiency virus [HIV] disease: Secondary | ICD-10-CM

## 2011-02-05 LAB — CBC WITH DIFFERENTIAL/PLATELET
Basophils Relative: 0 % (ref 0–1)
Eosinophils Absolute: 0 10*3/uL (ref 0.0–0.7)
Eosinophils Relative: 1 % (ref 0–5)
Hemoglobin: 16.6 g/dL (ref 13.0–17.0)
MCH: 39.5 pg — ABNORMAL HIGH (ref 26.0–34.0)
MCHC: 35.2 g/dL (ref 30.0–36.0)
MCV: 112.1 fL — ABNORMAL HIGH (ref 78.0–100.0)
Monocytes Relative: 6 % (ref 3–12)
Neutrophils Relative %: 82 % — ABNORMAL HIGH (ref 43–77)

## 2011-02-05 LAB — COMPLETE METABOLIC PANEL WITH GFR
Alkaline Phosphatase: 81 U/L (ref 39–117)
BUN: 14 mg/dL (ref 6–23)
Creat: 0.98 mg/dL (ref 0.50–1.35)
GFR, Est Non African American: 86 mL/min
Glucose, Bld: 82 mg/dL (ref 70–99)
Sodium: 135 mEq/L (ref 135–145)
Total Bilirubin: 0.4 mg/dL (ref 0.3–1.2)
Total Protein: 6.8 g/dL (ref 6.0–8.3)

## 2011-02-05 LAB — LACTATE DEHYDROGENASE: LDH: 251 U/L — ABNORMAL HIGH (ref 94–250)

## 2011-02-05 LAB — T-HELPER CELL (CD4) - (RCID CLINIC ONLY): CD4 % Helper T Cell: 4 % — ABNORMAL LOW (ref 33–55)

## 2011-02-05 MED ORDER — PREDNISONE 20 MG PO TABS: ORAL_TABLET | ORAL | Status: DC

## 2011-02-05 MED ORDER — VALACYCLOVIR HCL 1 G PO TABS: 1000.0000 mg | ORAL_TABLET | Freq: Every day | ORAL | Status: DC

## 2011-02-05 NOTE — Assessment & Plan Note (Signed)
Mr. Pooley has an extensive mutation lists conferring resistance to essentially all nucleoside reverse transcriptase inhibitors as well as Sustiva and Viramune. He has been well suppressed on his regimen of twice daily Combivir, twice daily isentress N. twice daily intelence. 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 Continue on it on his current regimen although it is I have technically 3 fully active drugs. Past you've been intolerant to  Protease  inhibitor therapy due to problems with nausea though we may need to go back to such therapy should he fail his current regimen.

## 2011-02-05 NOTE — Assessment & Plan Note (Signed)
Patient is concerned that he might have PCP pneumonia he has no fever no productive cough we'll check a chest x-ray. Also asked her to word "5 results the patient's pulmonary function tests and having difficulty locating them and that at that electronically record system.

## 2011-02-05 NOTE — Assessment & Plan Note (Signed)
Has improved dramatically dropped Valtrex down to prophylactic dose 1 g daily.

## 2011-02-05 NOTE — Progress Notes (Signed)
Subjective:    Patient ID: Paul [name], male    DOB: [birth year], 55 y.o.   MRN: [id number]  HPI  Paul [name] is a 55 year old man with HIV and AIDS who has developed a ulcerative granulating lesion on his right lower eyelid as well as the medial canthus and the contralateral left side. He was also found to have pseudomembranes involving the right inferior fornix and the bulbar conjunctiva. He had been treated with various oral antibiotics as well as topical agents by Dr. Karleen Hampshire and then seen by Dr. Carilyn Goodpasture and Dr. Orson Slick at [facility] medical center . 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 This was later changed to doxycline and he has had essentially no improvement in these lesions. He is also Has been taking topical gancyclovir. He had biopsy done in late August that showed suppurative dermatitis, repeat biopsy in September that showed infiltrate of plasma cells a small minority of which showed CMV positivity. There was no evidence of lymphoma. We had also obtained a CT of the head that had showed extension of the edema in the right eyelid into the orbit, left eyelid lesion did not extend into the orbit. Last week he underwent 3rd bipsy this time with cultures and pCRs obtained in addition to path. [facility] and oral prednisone this in September due to our concerns that this represented an IRIS to HSV infection (he had been having flares of hsv2 keratitis for many years rx by Dr. Karleen Hampshire with stripping of membranes and rx with valtrex). He had by then already improved with diminution of the eyelid lesions slightly. HE was given IV solumedrol and iv acylclovir now change to oral prednisone and valtrex. He returns to clinic for followup and is continuing to feel better. Biopsy was negative for AFB, fungi. Bacterial culture grew a Coag neg staph and a  propionobacterium (likely contaminants). CMV PCR negative. 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 I saw him in followup and his edema had continued to improve. We planned on corticosteroid taper. He missed his followup appointment last month but now returns to clinic. He continues to take prednisone at a dose of 60 mg per day and continues to Valtrex 3 times a day actually. He had been seen in followup [facility] and also with Dr. Karleen Hampshire. He continued to take TobraDex eye drops no longer any antiviral eyedrops. He claims the eyelid here and to his antiretroviral regimen of twice daily Combivir twice daily isentress and twice daily intelence. Does continue to complain of some dyspnea on exertion and has been worked up previously by Dr. Shona Simpson with pulmonary function tests.   Review of Systems  Constitutional: Negative for fever, chills, diaphoresis, activity change, appetite change, fatigue and unexpected weight change.  HENT: Negative for congestion, sore throat, rhinorrhea, sneezing, trouble swallowing and sinus pressure.   Eyes: Negative for photophobia and visual disturbance.  Respiratory: Positive for shortness of breath. Negative for cough, chest tightness, wheezing and stridor.   Cardiovascular: Negative for chest pain, palpitations and leg swelling.  Gastrointestinal: Negative for nausea, vomiting, abdominal pain, diarrhea, constipation, blood in stool, abdominal distention and anal bleeding.  Genitourinary: Negative for dysuria, hematuria, flank pain and difficulty urinating.  Musculoskeletal: Negative for myalgias, back pain, joint swelling, arthralgias and gait problem.  Skin: Negative for color change, pallor, rash and wound.  Neurological: Negative for dizziness, tremors, weakness and  light-headedness.  Hematological: Negative for adenopathy. Does not bruise/bleed easily.  Psychiatric/Behavioral: Negative for behavioral problems, confusion, sleep disturbance, dysphoric mood, decreased  concentration and agitation.       Objective:   Physical Exam  Constitutional: He is oriented to person, place, and time. He appears well-developed and well-nourished. No distress.  HENT:  Head: Normocephalic and atraumatic.  Mouth/Throat: Oropharynx is clear and moist. No oropharyngeal exudate.  Eyes: Conjunctivae and EOM are normal. Pupils are equal, round, and reactive to light. No scleral icterus.    Neck: Normal range of motion. Neck supple. No JVD present.  Cardiovascular: Normal rate, regular rhythm and normal heart sounds.  Exam reveals no gallop and no friction rub.   No murmur heard. Pulmonary/Chest: Effort normal and breath sounds normal. No respiratory distress. He has no wheezes. He has no rales. He exhibits no tenderness.  Abdominal: He exhibits no distension and no mass. There is no tenderness. There is no rebound and no guarding.  Musculoskeletal: He exhibits no edema and no tenderness.  Lymphadenopathy:    He has no cervical adenopathy.  Neurological: He is alert and oriented to person, place, and time. He has normal reflexes. He exhibits normal muscle tone. Coordination normal.  Skin: Skin is warm and dry. He is not diaphoretic. No erythema. No pallor.  Psychiatric: He has a normal mood and affect. His behavior is normal. Judgment and thought content normal.          Assessment & Plan:  HIV DISEASE Mr. Bunda has an extensive mutation lists conferring resistance to essentially all nucleoside reverse transcriptase inhibitors as well as Sustiva and Viramune. He has been well suppressed on his regimen of twice daily Combivir, twice daily isentress N. twice daily intelence. 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 Continue on it on his current regimen although it is I have technically 3 fully active drugs. Past you've been intolerant to  Protease  inhibitor  therapy due to problems with nausea though we may need to go back to such therapy should he fail his current regimen.  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 We'll taper his corticosteroids carefully and put him on suppressive Valtrex  IRIS (immune reconstitution inflammatory syndrome) Taper prednisone to 40 mg his next week month and then down to 20 mg a month afterwards. Reduce Valtrex 1 g daily.  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  DOE (dyspnea on exertion) Patient is concerned that he might have PCP pneumonia he has no fever no productive cough we'll check a chest x-ray. Also asked her to word "5 results the patient's pulmonary function tests and having difficulty locating them and that at that electronically record system.

## 2011-02-05 NOTE — Assessment & Plan Note (Signed)
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 We'll taper his corticosteroids carefully and put him on suppressive Valtrex

## 2011-02-05 NOTE — Assessment & Plan Note (Signed)
Taper prednisone to 40 mg his next week month and then down to 20 mg a month afterwards. Reduce Valtrex 1 g daily.

## 2011-02-07 LAB — HIV-1 RNA QUANT-NO REFLEX-BLD: HIV 1 RNA Quant: <20 copies/mL (ref <20)

## 2011-02-25 ENCOUNTER — Other Ambulatory Visit: Payer: Self-pay | Admitting: Infectious Disease

## 2011-03-01 ENCOUNTER — Other Ambulatory Visit: Payer: Self-pay | Admitting: Infectious Diseases

## 2011-03-03 ENCOUNTER — Telehealth: Payer: Self-pay | Admitting: Infectious Disease

## 2011-03-03 NOTE — Telephone Encounter (Signed)
Checking status on Patient Assistance Programs.  Patient now has Medicare and Medicaid.  No longer eligible for PAP.

## 2011-03-24 ENCOUNTER — Other Ambulatory Visit (INDEPENDENT_AMBULATORY_CARE_PROVIDER_SITE_OTHER): Payer: Medicare PPO

## 2011-03-24 ENCOUNTER — Other Ambulatory Visit: Payer: Self-pay | Admitting: Infectious Disease

## 2011-03-24 DIAGNOSIS — H05019 Cellulitis of unspecified orbit: Secondary | ICD-10-CM

## 2011-03-24 DIAGNOSIS — R0609 Other forms of dyspnea: Secondary | ICD-10-CM

## 2011-03-24 DIAGNOSIS — R0602 Shortness of breath: Secondary | ICD-10-CM

## 2011-03-24 DIAGNOSIS — B029 Zoster without complications: Secondary | ICD-10-CM

## 2011-03-24 DIAGNOSIS — B009 Herpesviral infection, unspecified: Secondary | ICD-10-CM

## 2011-03-24 DIAGNOSIS — B0052 Herpesviral keratitis: Secondary | ICD-10-CM

## 2011-03-24 DIAGNOSIS — B20 Human immunodeficiency virus [HIV] disease: Secondary | ICD-10-CM

## 2011-03-24 DIAGNOSIS — D893 Immune reconstitution syndrome: Secondary | ICD-10-CM

## 2011-03-24 LAB — CBC WITH DIFFERENTIAL/PLATELET
Eosinophils Absolute: 0.2 10*3/uL (ref 0.0–0.7)
Eosinophils Relative: 4 % (ref 0–5)
HCT: 44.5 % (ref 39.0–52.0)
Hemoglobin: 15.3 g/dL (ref 13.0–17.0)
Lymphocytes Relative: 26 % (ref 12–46)
Lymphs Abs: 1.2 10*3/uL (ref 0.7–4.0)
MCH: 39 pg — ABNORMAL HIGH (ref 26.0–34.0)
MCV: 113.5 fL — ABNORMAL HIGH (ref 78.0–100.0)
Monocytes Absolute: 0.4 10*3/uL (ref 0.1–1.0)
Monocytes Relative: 8 % (ref 3–12)
RBC: 3.92 MIL/uL — ABNORMAL LOW (ref 4.22–5.81)

## 2011-03-24 LAB — COMPLETE METABOLIC PANEL WITH GFR
ALT: 31 U/L (ref 0–53)
AST: 37 U/L (ref 0–37)
Alkaline Phosphatase: 66 U/L (ref 39–117)
Calcium: 9.7 mg/dL (ref 8.4–10.5)
Chloride: 104 mEq/L (ref 96–112)
Creat: 1.27 mg/dL (ref 0.50–1.35)
Total Bilirubin: 0.6 mg/dL (ref 0.3–1.2)

## 2011-03-25 LAB — T-HELPER CELL (CD4) - (RCID CLINIC ONLY)
CD4 % Helper T Cell: 10 % — ABNORMAL LOW (ref 33–55)
CD4 T Cell Abs: 110 uL — ABNORMAL LOW (ref 400–2700)

## 2011-03-26 LAB — HIV-1 RNA QUANT-NO REFLEX-BLD
HIV 1 RNA Quant: 52 copies/mL — ABNORMAL HIGH (ref <20)
HIV-1 RNA Quant, Log: 1.72 {Log} — ABNORMAL HIGH (ref <1.30)

## 2011-04-07 ENCOUNTER — Encounter: Payer: Self-pay | Admitting: Infectious Disease

## 2011-04-07 ENCOUNTER — Ambulatory Visit (INDEPENDENT_AMBULATORY_CARE_PROVIDER_SITE_OTHER): Payer: Medicaid Other | Admitting: Infectious Disease

## 2011-04-07 ENCOUNTER — Telehealth: Payer: Self-pay | Admitting: Licensed Clinical Social Worker

## 2011-04-07 DIAGNOSIS — B009 Herpesviral infection, unspecified: Secondary | ICD-10-CM

## 2011-04-07 DIAGNOSIS — R6510 Systemic inflammatory response syndrome (SIRS) of non-infectious origin without acute organ dysfunction: Secondary | ICD-10-CM

## 2011-04-07 DIAGNOSIS — B20 Human immunodeficiency virus [HIV] disease: Secondary | ICD-10-CM

## 2011-04-07 DIAGNOSIS — B029 Zoster without complications: Secondary | ICD-10-CM

## 2011-04-07 DIAGNOSIS — B0052 Herpesviral keratitis: Secondary | ICD-10-CM

## 2011-04-07 DIAGNOSIS — H05019 Cellulitis of unspecified orbit: Secondary | ICD-10-CM

## 2011-04-07 DIAGNOSIS — R609 Edema, unspecified: Secondary | ICD-10-CM

## 2011-04-07 DIAGNOSIS — D893 Immune reconstitution syndrome: Secondary | ICD-10-CM

## 2011-04-07 DIAGNOSIS — R0609 Other forms of dyspnea: Secondary | ICD-10-CM

## 2011-04-07 DIAGNOSIS — R0602 Shortness of breath: Secondary | ICD-10-CM

## 2011-04-07 LAB — CBC WITH DIFFERENTIAL/PLATELET
Basophils Absolute: 0 10*3/uL (ref 0.0–0.1)
Basophils Relative: 1 % (ref 0–1)
HCT: 42.5 % (ref 39.0–52.0)
Lymphocytes Relative: 30 % (ref 12–46)
MCHC: 34.6 g/dL (ref 30.0–36.0)
Monocytes Absolute: 0.5 10*3/uL (ref 0.1–1.0)
Neutro Abs: 2.7 10*3/uL (ref 1.7–7.7)
Neutrophils Relative %: 54 % (ref 43–77)
Platelets: 244 10*3/uL (ref 150–400)
RDW: 12.6 % (ref 11.5–15.5)
WBC: 4.9 10*3/uL (ref 4.0–10.5)

## 2011-04-07 LAB — BASIC METABOLIC PANEL
BUN: 9 mg/dL (ref 6–23)
CO2: 23 mEq/L (ref 19–32)
Calcium: 9.8 mg/dL (ref 8.4–10.5)
Creat: 1.19 mg/dL (ref 0.50–1.35)
Glucose, Bld: 87 mg/dL (ref 70–99)

## 2011-04-07 MED ORDER — POTASSIUM CHLORIDE CRYS ER 10 MEQ PO TBCR: 20.0000 meq | EXTENDED_RELEASE_TABLET | Freq: Every day | ORAL | Status: DC

## 2011-04-07 MED ORDER — VALACYCLOVIR HCL 1 G PO TABS: 1000.0000 mg | ORAL_TABLET | Freq: Every day | ORAL | Status: DC

## 2011-04-07 MED ORDER — FUROSEMIDE 20 MG PO TABS: 40.0000 mg | ORAL_TABLET | Freq: Every day | ORAL | Status: DC

## 2011-04-07 NOTE — Telephone Encounter (Signed)
Called patient to let him that is ECHO is scheduled for 04/21/2010 at 10:00am needs to arrive at 9:45am. Left the message on home and cell phone.

## 2011-04-07 NOTE — Assessment & Plan Note (Signed)
Keep him on valtrex daily, he had IRIS to this. Continue fu with ophthamology

## 2011-04-07 NOTE — Assessment & Plan Note (Signed)
Well controlled on salvage regimen. Bring back for HIV labs in 1 month

## 2011-04-07 NOTE — Progress Notes (Signed)
Subjective:    Patient ID: Paul [name], male    DOB: 18-Nov-1955, 56 y.o.   MRN: [id number]  HPI  Paul [name] is a 56 year old man with HIV and AIDS who has developed a ulcerative granulating lesion on his right lower eyelid as well as the medial canthus and the contralateral left side. He was also found to have pseudomembranes involving the right inferior fornix and the bulbar conjunctiva. He had been treated with various oral antibiotics as well as topical agents by Dr. Karleen Hampshire and then seen by Dr. Carilyn Goodpasture and Dr. Orson Slick at [facility] medical center . 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 This was later changed to doxycline and he has had essentially no improvement in these lesions. He is also Has been taking topical gancyclovir. He had biopsy done in late August that showed suppurative dermatitis, repeat biopsy in September that showed infiltrate of plasma cells a small minority of which showed CMV positivity. There was no evidence of lymphoma. We had also obtained a CT of the head that had showed extension of the edema in the right eyelid into the orbit, left eyelid lesion did not extend into the orbit. Last week he underwent 3rd bipsy this time with cultures and pCRs obtained in addition to path. [facility] and oral prednisone this in September due to our concerns that this represented an IRIS to HSV infection (he had been having flares of hsv2 keratitis for many years rx by Dr. Karleen Hampshire with stripping of membranes and rx with valtrex). He had by then already improved with diminution of the eyelid lesions slightly. HE was given IV solumedrol and iv acylclovir now change to oral prednisone and valtrex. He returns to clinic for followup and is continuing to feel better. Biopsy was negative for AFB, fungi. Bacterial culture grew a Coag neg staph and a  propionobacterium (likely contaminants). CMV PCR negative. 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 I saw him in followup and his edema had continued to improve. We planned on corticosteroid taper. He had  missed his followup appointment but then was seen by me.  He had continued to take prednisone at a dose of 60 mg per day and continued to Valtrex 3 times a day actually. He had been seen in followup [facility] and also with Dr. Karleen Hampshire. He continued to take TobraDex eye drops no longer any antiviral eyedrops, When I saw him in December we put him on taper of prednisone and asked him to continue valtrex daily but he stopped the latter. He is still taking his tobradex--which hardly seems necessary at this point but he wishes to continue till seen by Dr. Karleen Hampshire.   He has worsening swelling in his legs bilaterally and worsening DOE over the past few weeks. He has not had fevers. At times he notices himself wheezing.   I spent greater than 45 minutes with the patient including greater than 50% of time in face to face counsel of the patient and in coordination of their care.      Review of Systems  Constitutional: Negative for fever, chills, diaphoresis, activity change, appetite change, fatigue and unexpected weight change.  HENT: Negative for congestion, sore throat, rhinorrhea, sneezing, trouble swallowing and sinus pressure.   Eyes: Negative for photophobia and visual disturbance.  Respiratory: Positive for wheezing. Negative for cough, chest tightness, shortness of breath and stridor.  Cardiovascular: Positive for leg swelling. Negative for chest pain and palpitations.  Gastrointestinal: Negative for nausea, vomiting, abdominal pain, diarrhea, constipation, blood in stool, abdominal distention and anal bleeding.  Genitourinary: Negative for dysuria, hematuria, flank pain and difficulty urinating.  Musculoskeletal: Negative for myalgias, back pain, joint swelling, arthralgias and gait problem.    Skin: Negative for color change, pallor, rash and wound.  Neurological: Negative for dizziness, tremors, weakness and light-headedness.  Hematological: Negative for adenopathy. Does not bruise/bleed easily.  Psychiatric/Behavioral: Negative for behavioral problems, confusion, sleep disturbance, dysphoric mood, decreased concentration and agitation.       Objective:   Physical Exam  Constitutional: He is oriented to person, place, and time. He appears well-developed and well-nourished. No distress.  HENT:  Head: Normocephalic and atraumatic.  Mouth/Throat: Oropharynx is clear and moist. No oropharyngeal exudate.  Eyes: Conjunctivae and EOM are normal. Pupils are equal, round, and reactive to light. No scleral icterus.    Neck: Normal range of motion. Neck supple. No JVD present.  Cardiovascular: Normal rate, regular rhythm and normal heart sounds.  Exam reveals no gallop and no friction rub.   No murmur heard. Pulmonary/Chest: Effort normal and breath sounds normal. No respiratory distress. He has no wheezes. He has no rales. He exhibits no tenderness.  Abdominal: He exhibits no distension and no mass. There is no tenderness. There is no rebound and no guarding.  Musculoskeletal: He exhibits edema. He exhibits no tenderness.  Lymphadenopathy:    He has no cervical adenopathy.  Neurological: He is alert and oriented to person, place, and time. He has normal reflexes. He exhibits normal muscle tone. Coordination normal.  Skin: Skin is warm and dry. He is not diaphoretic. No erythema. No pallor.  Psychiatric: He has a normal mood and affect. His behavior is normal. Judgment and thought content normal.          Assessment & Plan:

## 2011-04-07 NOTE — Assessment & Plan Note (Signed)
Clinically appears to have volume overload, and ? CHF. Will check bnp, bmp, cbc and diurese with 40 lasix, suupplement k, check echo and bring back in two weeks time to clinic

## 2011-04-07 NOTE — Assessment & Plan Note (Signed)
Seems largely resolved. Needs to stay on ARV and  Avoid yo-yoing of viral load

## 2011-04-07 NOTE — Assessment & Plan Note (Signed)
Keep on valtrex lifelong

## 2011-04-11 ENCOUNTER — Other Ambulatory Visit: Payer: Self-pay | Admitting: Infectious Diseases

## 2011-04-21 ENCOUNTER — Ambulatory Visit (INDEPENDENT_AMBULATORY_CARE_PROVIDER_SITE_OTHER): Payer: Medicare PPO | Admitting: Infectious Disease

## 2011-04-21 ENCOUNTER — Encounter: Payer: Self-pay | Admitting: Infectious Disease

## 2011-04-21 VITALS — BP 122/81 | HR 79 | Temp 97.4°F | Ht 69.0 in | Wt 214.0 lb

## 2011-04-21 DIAGNOSIS — D893 Immune reconstitution syndrome: Secondary | ICD-10-CM

## 2011-04-21 DIAGNOSIS — R609 Edema, unspecified: Secondary | ICD-10-CM

## 2011-04-21 DIAGNOSIS — R6510 Systemic inflammatory response syndrome (SIRS) of non-infectious origin without acute organ dysfunction: Secondary | ICD-10-CM

## 2011-04-21 DIAGNOSIS — R0609 Other forms of dyspnea: Secondary | ICD-10-CM

## 2011-04-21 DIAGNOSIS — B20 Human immunodeficiency virus [HIV] disease: Secondary | ICD-10-CM

## 2011-04-21 DIAGNOSIS — R109 Unspecified abdominal pain: Secondary | ICD-10-CM

## 2011-04-21 MED ORDER — FUROSEMIDE 40 MG PO TABS: 40.0000 mg | ORAL_TABLET | ORAL | Status: DC

## 2011-04-21 MED ORDER — POTASSIUM CHLORIDE CRYS ER 10 MEQ PO TBCR: 20.0000 meq | EXTENDED_RELEASE_TABLET | ORAL | Status: DC

## 2011-04-21 NOTE — Assessment & Plan Note (Signed)
Doing well without steroids anymore, continuing valtrex

## 2011-04-21 NOTE — Progress Notes (Signed)
  Subjective:    Patient ID: Paul [name], male    DOB: 31-Jan-1956, 56 y.o.   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 After lasix 40mg  daily his ssx have improved and he has diminished LE edema but still DOE. Upon questions he states that he has had PFTs performed at [facility] a year or so ago.   Review of Systems  Constitutional: Negative for fever, chills, diaphoresis, activity change, appetite change, fatigue and unexpected weight change.  HENT: Negative for congestion, sore throat, rhinorrhea, sneezing, trouble swallowing and sinus pressure.   Eyes: Negative for photophobia and visual disturbance.  Respiratory: Positive for shortness of breath. Negative for cough, chest tightness, wheezing and stridor.   Cardiovascular: Negative for chest pain, palpitations and leg swelling.  Gastrointestinal: Negative for nausea, vomiting, abdominal pain, diarrhea, constipation, blood in stool, abdominal distention and anal bleeding.  Genitourinary: Negative for dysuria, hematuria, flank pain and difficulty urinating.  Musculoskeletal: Negative for myalgias, back pain, joint swelling, arthralgias and gait problem.  Skin: Negative for color change, pallor, rash and wound.  Neurological: Negative for dizziness, tremors, weakness and light-headedness.  Hematological: Negative for adenopathy. Does not bruise/bleed easily.  Psychiatric/Behavioral: Negative for behavioral problems, confusion, sleep disturbance, dysphoric mood, decreased concentration and agitation.       Objective:   Physical Exam  Constitutional: He is oriented to person, place, and time. He appears well-developed and well-nourished. No distress.  HENT:  Head: Normocephalic and atraumatic.  Mouth/Throat: Oropharynx is clear and moist. No oropharyngeal  exudate.  Eyes: Conjunctivae and EOM are normal. Pupils are equal, round, and reactive to light. No scleral icterus.         Still with periiorbital edema but much improved  Neck: Normal range of motion. Neck supple. No JVD present.  Cardiovascular: Normal rate, regular rhythm and normal heart sounds.  Exam reveals no gallop and no friction rub.   No murmur heard. Pulmonary/Chest: Effort normal and breath sounds normal. No respiratory distress. He has no wheezes. He has no rales. He exhibits no tenderness.  Abdominal: He exhibits no distension and no mass. There is no tenderness. There is no rebound and no guarding.  Musculoskeletal: He exhibits edema. He exhibits no tenderness.  Lymphadenopathy:    He has no cervical adenopathy.  Neurological: He is alert and oriented to person, place, and time. He has normal reflexes. He exhibits normal muscle tone. Coordination normal.  Skin: Skin is warm and dry. He is not diaphoretic. No erythema. No pallor.  Psychiatric: He has a normal mood and affect. His behavior is normal. Judgment and thought content normal.          Assessment & Plan:  HIV DISEASE Continue current salvage regimen and OI prophylaxis  IRIS (immune reconstitution inflammatory syndrome) Doing well without steroids anymore, continuing valtrex  DOE (dyspnea on exertion) Seems improved a little with lasix, WIll increase diueresis, TTE pending. I would like to get ahold of his PFT's

## 2011-04-21 NOTE — Assessment & Plan Note (Signed)
Seems improved a little with lasix, WIll increase diueresis, TTE pending. I would like to get ahold of his PFT's

## 2011-04-21 NOTE — Assessment & Plan Note (Signed)
Continue current salvage regimen and OI prophylaxis 

## 2011-04-22 ENCOUNTER — Ambulatory Visit (HOSPITAL_COMMUNITY)
Admission: RE | Admit: 2011-04-22 | Discharge: 2011-04-22 | Disposition: A | Discharge status: Home / Self Care | Payer: Medicare PPO | Source: Ambulatory Visit | Attending: Infectious Disease | Admitting: Infectious Disease

## 2011-04-22 DIAGNOSIS — Z21 Asymptomatic human immunodeficiency virus [HIV] infection status: Secondary | ICD-10-CM | POA: Insufficient documentation

## 2011-04-22 DIAGNOSIS — R0989 Other specified symptoms and signs involving the circulatory and respiratory systems: Secondary | ICD-10-CM | POA: Insufficient documentation

## 2011-04-22 DIAGNOSIS — R609 Edema, unspecified: Secondary | ICD-10-CM | POA: Insufficient documentation

## 2011-04-22 DIAGNOSIS — R0609 Other forms of dyspnea: Secondary | ICD-10-CM | POA: Insufficient documentation

## 2011-04-22 NOTE — Progress Notes (Signed)
  Echocardiogram 2D Echocardiogram has been performed.  Zakariyah Freimark, Real Cons 04/22/2011, 11:30 AM

## 2011-04-28 ENCOUNTER — Telehealth: Payer: Self-pay | Admitting: Licensed Clinical Social Worker

## 2011-04-28 NOTE — Telephone Encounter (Signed)
Patient called about test results from echo on 04/22/2011. Please advise

## 2011-05-04 NOTE — Telephone Encounter (Signed)
Echo results were showed slight right sided failiure. WOuld like to find out what his PFTs showed from [place] a year ago

## 2011-05-05 NOTE — Telephone Encounter (Signed)
Per Dr. Daiva Eves it is the left ventricles that's showing some failure. The heart has problems with relaxing and filling up with blood. According to Dr. Daiva Eves this can only be monitored at this point.

## 2011-05-05 NOTE — Telephone Encounter (Signed)
He called to find out what the test results were. I told him & that we were waiting to get the PFT results from last year. He is taking his lasix & potassium. Advised avoiding excess salt, sweets & fried foods. Told him we will be in contact after the md gets those test results

## 2011-05-21 ENCOUNTER — Other Ambulatory Visit: Payer: Medicare PPO

## 2011-05-21 ENCOUNTER — Other Ambulatory Visit: Payer: Self-pay | Admitting: Infectious Disease

## 2011-05-21 DIAGNOSIS — Z79899 Other long term (current) drug therapy: Secondary | ICD-10-CM

## 2011-05-21 DIAGNOSIS — Z113 Encounter for screening for infections with a predominantly sexual mode of transmission: Secondary | ICD-10-CM

## 2011-05-21 DIAGNOSIS — B20 Human immunodeficiency virus [HIV] disease: Secondary | ICD-10-CM

## 2011-05-21 LAB — COMPLETE METABOLIC PANEL WITHOUT GFR
ALT: 20 U/L (ref 0–53)
AST: 25 U/L (ref 0–37)
Albumin: 4.4 g/dL (ref 3.5–5.2)
Alkaline Phosphatase: 76 U/L (ref 39–117)
BUN: 12 mg/dL (ref 6–23)
CO2: 26 meq/L (ref 19–32)
Calcium: 9.6 mg/dL (ref 8.4–10.5)
Chloride: 101 meq/L (ref 96–112)
Creat: 1.17 mg/dL (ref 0.50–1.35)
GFR, Est African American: 80 mL/min
GFR, Est Non African American: 69 mL/min
Glucose, Bld: 79 mg/dL (ref 70–99)
Potassium: 4.4 meq/L (ref 3.5–5.3)
Sodium: 137 meq/L (ref 135–145)
Total Bilirubin: 0.4 mg/dL (ref 0.3–1.2)
Total Protein: 7.8 g/dL (ref 6.0–8.3)

## 2011-05-21 LAB — CBC WITH DIFFERENTIAL/PLATELET
Basophils Absolute: 0 10*3/uL (ref 0.0–0.1)
Basophils Relative: 1 % (ref 0–1)
Eosinophils Absolute: 0.2 10*3/uL (ref 0.0–0.7)
Eosinophils Relative: 6 % — ABNORMAL HIGH (ref 0–5)
HCT: 44.1 % (ref 39.0–52.0)
MCH: 38.5 pg — ABNORMAL HIGH (ref 26.0–34.0)
MCHC: 34.2 g/dL (ref 30.0–36.0)
MCV: 112.5 fL — ABNORMAL HIGH (ref 78.0–100.0)
Monocytes Absolute: 0.5 10*3/uL (ref 0.1–1.0)
RDW: 12.6 % (ref 11.5–15.5)

## 2011-05-22 LAB — T-HELPER CELL (CD4) - (RCID CLINIC ONLY)
CD4 % Helper T Cell: 11 % — ABNORMAL LOW (ref 33–55)
CD4 T Cell Abs: 130 uL — ABNORMAL LOW (ref 400–2700)

## 2011-06-04 ENCOUNTER — Encounter: Payer: Self-pay | Admitting: Infectious Disease

## 2011-06-04 ENCOUNTER — Ambulatory Visit (INDEPENDENT_AMBULATORY_CARE_PROVIDER_SITE_OTHER): Payer: Medicare Other | Admitting: Infectious Disease

## 2011-06-04 ENCOUNTER — Ambulatory Visit
Admission: RE | Admit: 2011-06-04 | Discharge: 2011-06-04 | Disposition: A | Discharge status: Home / Self Care | Payer: Medicare Other | Source: Ambulatory Visit | Attending: Infectious Disease | Admitting: Infectious Disease

## 2011-06-04 VITALS — BP 104/70 | HR 92 | Temp 97.4°F | Wt 210.0 lb

## 2011-06-04 DIAGNOSIS — B20 Human immunodeficiency virus [HIV] disease: Secondary | ICD-10-CM

## 2011-06-04 DIAGNOSIS — D893 Immune reconstitution syndrome: Secondary | ICD-10-CM

## 2011-06-04 DIAGNOSIS — R6510 Systemic inflammatory response syndrome (SIRS) of non-infectious origin without acute organ dysfunction: Secondary | ICD-10-CM

## 2011-06-04 DIAGNOSIS — R0609 Other forms of dyspnea: Secondary | ICD-10-CM

## 2011-06-04 DIAGNOSIS — J984 Other disorders of lung: Secondary | ICD-10-CM | POA: Insufficient documentation

## 2011-06-04 DIAGNOSIS — B009 Herpesviral infection, unspecified: Secondary | ICD-10-CM

## 2011-06-04 DIAGNOSIS — G4700 Insomnia, unspecified: Secondary | ICD-10-CM

## 2011-06-04 LAB — CBC WITH DIFFERENTIAL/PLATELET
Basophils Absolute: 0 10*3/uL (ref 0.0–0.1)
Basophils Relative: 0 % (ref 0–1)
Hemoglobin: 14.9 g/dL (ref 13.0–17.0)
MCHC: 34.4 g/dL (ref 30.0–36.0)
Monocytes Relative: 13 % — ABNORMAL HIGH (ref 3–12)
Neutro Abs: 1 10*3/uL — ABNORMAL LOW (ref 1.7–7.7)
Neutrophils Relative %: 45 % (ref 43–77)
Platelets: 193 10*3/uL (ref 150–400)
RBC: 3.98 MIL/uL — ABNORMAL LOW (ref 4.22–5.81)

## 2011-06-04 LAB — BRAIN NATRIURETIC PEPTIDE: Brain Natriuretic Peptide: 5.4 pg/mL (ref 0.0–100.0)

## 2011-06-04 MED ORDER — ZOLPIDEM TARTRATE 10 MG PO TABS: 10.0000 mg | ORAL_TABLET | Freq: Every evening | ORAL | Status: DC | PRN

## 2011-06-04 NOTE — Assessment & Plan Note (Signed)
Doing well on salvage regimen

## 2011-06-04 NOTE — Patient Instructions (Signed)
I would like you to go back and see Dr. Sherene Sires  We will check your pulse ox today  Here and also contact Dr. Sherene Sires for appt

## 2011-06-04 NOTE — Assessment & Plan Note (Signed)
Will give him [place]

## 2011-06-04 NOTE — Assessment & Plan Note (Signed)
Will repeat CXR, continue current diuretics. Repeat cbc and cmp. We will arrange for home O2. He will need to be seen by [facility] again. I am not sure there is much we can do for his restrictive lung disease apart from providing supplmental O2 at this point.

## 2011-06-04 NOTE — Assessment & Plan Note (Signed)
I am arranging home O2 for him given pox to 80s on RA with ambulation. Would like him seen by [facility] again as well. ? Is due to scarring from his prior PCP

## 2011-06-04 NOTE — Assessment & Plan Note (Signed)
Had IRIS to HSV2 in eyes, resolved and on suppressive valtrex

## 2011-06-04 NOTE — Progress Notes (Signed)
Subjective:    Patient ID: Paul [name], male    DOB: 08/20/1955, 56 y.o.   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 He has been having worsening DOE despite rx with lasix bid. His CXR did not previously show anything to suggest acute pneumonia. I obtained Echo which showed some diastolic dysfunction. His PFTs done in July did not show obstructive lung disease but did show restictive disease. Since last being seen he endorsed symptoms of a "cold" with sinus congestion, subjective fevers more than a week ago. Since then he has had worsening DOE, and cough esp at night. Cough is nonproductive. On RA pox was 97 but with ambulation down to 88%. LE edema is better on the lasix.   Review of Systems  Constitutional: Negative for fever, chills, diaphoresis, activity change, appetite change, fatigue and unexpected weight change.  HENT: Negative for congestion, sore throat, rhinorrhea, sneezing, trouble swallowing and sinus pressure.   Eyes: Negative for photophobia and visual disturbance.  Respiratory: Positive for cough and shortness of breath. Negative for chest tightness, wheezing and stridor.   Cardiovascular: Negative for chest pain, palpitations and leg swelling.  Gastrointestinal: Negative for nausea, vomiting, abdominal pain, diarrhea, constipation, blood in stool, abdominal distention and anal bleeding.  Genitourinary: Negative for dysuria, hematuria, flank pain and difficulty urinating.  Musculoskeletal: Negative for myalgias, back pain, joint swelling, arthralgias and gait problem.  Skin: Negative for color change, pallor, rash and wound.  Neurological: Negative for dizziness, tremors, weakness and light-headedness.  Hematological: Negative for adenopathy. Does not bruise/bleed easily.  Psychiatric/Behavioral:  Negative for behavioral problems, confusion, sleep disturbance, dysphoric mood, decreased concentration and agitation.       Objective:   Physical Exam  Constitutional: He is oriented to person, place, and time. He appears well-developed and well-nourished. No distress.  HENT:  Head: Normocephalic and atraumatic.    Mouth/Throat: Oropharynx is clear and moist. No oropharyngeal exudate.  Eyes: Conjunctivae and EOM are normal. Pupils are equal, round, and reactive to light. No scleral icterus.  Neck: Normal range of motion. Neck supple. No JVD present.  Cardiovascular: Normal rate, regular rhythm and normal heart sounds.  Exam reveals no gallop and no friction rub.   No murmur heard. Pulmonary/Chest: Effort normal. No respiratory distress. He has no wheezes. He has rales. He exhibits no tenderness.  Abdominal: He exhibits no distension and no mass. There is no tenderness. There is no rebound and no guarding.  Musculoskeletal: He exhibits no edema and no tenderness.  Lymphadenopathy:    He has no cervical adenopathy.  Neurological: He is alert and oriented to person, place, and time. He has normal reflexes. He exhibits normal muscle tone. Coordination normal.  Skin: Skin is warm and dry. He is not diaphoretic. No erythema. No pallor.  Psychiatric: He has a normal mood and affect. His behavior is normal. Judgment and thought content normal.          Assessment & Plan:  DOE (dyspnea on exertion) Will repeat CXR, continue current diuretics. Repeat cbc and cmp. We will arrange for home O2. He will need to be seen by [facility] again. I am not sure there is much we can do for his restrictive lung disease apart from providing supplmental O2 at this point.  HIV DISEASE Doing well on salvage regimen  IRIS (immune reconstitution inflammatory syndrome) Had IRIS to HSV2 in eyes, resolved and on suppressive valtrex  Insomnia Will give him ambien  HSV-2 (herpes simplex virus 2)  infection Continue suppressive valtrex  Restrictive lung disease I am arranging home O2 for him given pox to 80s on RA with ambulation. Would like him seen by [facility] again as well. ? Is due to scarring from his prior PCP

## 2011-06-04 NOTE — Assessment & Plan Note (Signed)
Continue suppressive valtrex.  

## 2011-06-05 ENCOUNTER — Telehealth: Payer: Self-pay | Admitting: *Deleted

## 2011-06-05 ENCOUNTER — Encounter: Payer: Self-pay | Admitting: *Deleted

## 2011-06-05 NOTE — Telephone Encounter (Signed)
i SUPPOSE WE WILL HAVE TO HAVE 3 SEPARATE READINGS. I THINK THAT IS PRETTY DUMB REQUIREMENT

## 2011-06-05 NOTE — Telephone Encounter (Signed)
Called patient to advised him of the status of the O2 set up with [facility]. They require him to have 2 additional O2 of 88 or less and recovery time documented before we can apply for O2 for him. He advised that he has an appointment with his pulmonologist set for 06/18/11 or 06/20/11 he is not exactly sure but he advised that he is feeling fine today and wants to see what Dr Sherene Sires says if we can hold off. If not he will call us and come in the following week to have an O2 done. Advised him will forward this note to the provider and get back to him asap with an answer.

## 2011-06-05 NOTE — Telephone Encounter (Signed)
Medicare requires:  1 - resting O2 sat less than 88   or  3 - O2 sats that drop to 88 or less with exercise and then show recovery above 88 after applying O2 and how many liters  Pt to be called re:  Pulmonology referral.

## 2011-06-05 NOTE — Telephone Encounter (Signed)
IT DID DROP WITH POX <88 WITH WALKING

## 2011-06-05 NOTE — Progress Notes (Signed)
Faxed patient information to [facility] for the patients O2 order. Faxed office note and O2 order from provider to [phone].

## 2011-06-09 ENCOUNTER — Ambulatory Visit (INDEPENDENT_AMBULATORY_CARE_PROVIDER_SITE_OTHER): Payer: Medicaid Other | Admitting: Infectious Disease

## 2011-06-09 VITALS — BP 109/78 | HR 112 | Ht 69.0 in | Wt 210.0 lb

## 2011-06-09 DIAGNOSIS — J984 Other disorders of lung: Secondary | ICD-10-CM

## 2011-06-09 NOTE — Patient Instructions (Signed)
Pt reminded to keep appt with pulmonary MD.  Pt verbalized understanding and stated that he would keep appt.

## 2011-06-09 NOTE — Progress Notes (Signed)
Resting O2 sat 94% w/o O2.   Pt's O2 sat remained above 88% during ambulation greater than 50 feet.  No O2 needed during walking.  Pt informed that he currently does not meet Medicare guidelines for home O2 based on today's measurements.

## 2011-06-10 ENCOUNTER — Other Ambulatory Visit: Payer: Self-pay | Admitting: Infectious Disease

## 2011-06-10 DIAGNOSIS — Z113 Encounter for screening for infections with a predominantly sexual mode of transmission: Secondary | ICD-10-CM

## 2011-06-14 ENCOUNTER — Other Ambulatory Visit: Payer: Self-pay | Admitting: Infectious Diseases

## 2011-06-20 ENCOUNTER — Other Ambulatory Visit: Payer: Self-pay | Admitting: Infectious Diseases

## 2011-06-20 DIAGNOSIS — B20 Human immunodeficiency virus [HIV] disease: Secondary | ICD-10-CM

## 2011-06-23 ENCOUNTER — Ambulatory Visit (INDEPENDENT_AMBULATORY_CARE_PROVIDER_SITE_OTHER): Payer: Medicare Other | Admitting: Internal Medicine

## 2011-06-23 ENCOUNTER — Encounter: Payer: Self-pay | Admitting: Internal Medicine

## 2011-06-23 VITALS — BP 144/78 | HR 82 | Temp 97.4°F | Ht 69.0 in | Wt 215.0 lb

## 2011-06-23 DIAGNOSIS — R0602 Shortness of breath: Secondary | ICD-10-CM

## 2011-06-23 NOTE — Progress Notes (Signed)
Subjective:     Patient ID: Paul [name], male   DOB: 10/04/1955 .   MRN: [id number]   Brief patient profile:  55 yobm quit smoking around 2002 with no resp problems then around 2007 sob much worse for sev weeks/months eventually  better not sure what rx but not eval by pulmonary or on pulmonary meds then that he recalls,  then recurrent indolent onset progressive doe x sev months  while not any meds for HIV and referred to pulmonary clinic by [facility] for eval 07/2010  With pft's showing restrictive changes 09/2010   HPI 08/15/2010 Initial pulmonary office eval   cc sob x sev months with exertion (walking flat x 50 ft) assoc with intermittent dry cough, still not back on HIV meds due to insurance issues, with last CD4 count 60 07/15/10 but no fever on prophylactic dose of bactrim. Sleeping ok without nocturnal  or early am exac of resp c/o's or need for noct saba.  rec You need to start your medications as soon as possible because you may have a low grade form of pcp pneumonia  10/01/2010 ov/Lissette Schenk cc breathing and cough better on hiv rx. Here for f/u pfts. Not really limited by sob though no longer aerobically active. No purulent or excess sputum rec Return to this clinic if you feel you are losing any ground over the next 3 months in terms of your activity tolerance, but you should gradually improve to your satisfaction by the end of 3 months, if not please return here   06/23/2011 f/u ov/Anelise Staron cc much worse sob with "cold" x 3 months but now improving not using any inhalers, spont improved s rx and no longer coughing, no report of purulent sputum.  Pt denies any significant sore throat, dysphagia, itching, sneezing,  nasal congestion or excess/ purulent secretions,  fever, chills, sweats, unintended wt loss, pleuritic or exertional cp, hempoptysis, orthopnea pnd or leg swelling.    Also denies any obvious fluctuation of symptoms with weather or environmental changes or other aggravating or  alleviating factors.         .        Objective:   Physical Exam    pleasant bm amb nad   Wt 188 08/15/2010  > 189 10/01/2010 > 06/23/2011 215  HEENT mild turbinate edema.  Oropharynx no thrush or excess pnd or cobblestoning.  No JVD or cervical adenopathy. Mild accessory muscle hypertrophy. Trachea midline, nl thryroid. Chest was hyperinflated by percussion with diminished breath sounds and moderate increased exp time without wheeze. Hoover sign positive at mid inspiration. Regular rate and rhythm without murmur gallop or rub or increase P2 or edema.  Abd: no hsm, nl excursion. Ext warm without cyanosis or clubbing.    cxr 06/04/11 Findings: There is stable volume loss in the left hemithorax with  asymmetric fibrotic changes throughout the left lung. Mild right  apical scarring appears stable. No superimposed airspace disease  or pleural effusion is identified. The heart size and mediastinal  contours are stable. There are stable calcifications surrounding  the right coracoclavicular ligament. No acute osseous findings are  evident.  IMPRESSION:  Stable chronic asymmetric left lung disease as described. No acute  cardiopulmonary process.       Assessment:         Plan:

## 2011-06-23 NOTE — Patient Instructions (Signed)
Please schedule a follow up office visit in 6 weeks, call sooner if needed with pft's on return 

## 2011-06-26 NOTE — Assessment & Plan Note (Addendum)
-    Walked one lap @ 185 stopped due to  desat to 87 RA  08/15/10    - PFT's 10/01/2010  FEV1  1.98 (59%) ratio 82 and DLC0  36% corrects to 82   Not clear what this "cold x 3 months" was but suspect low grade pcp from hiv now improving on rx with underlying severe scarring in L lung due to previous infection(s) resulting in restrictive changes on pfts  No need for additional w/u at this point though a second set of PFTs to compare to 09/2010 would be appropriate.

## 2011-08-04 ENCOUNTER — Ambulatory Visit (INDEPENDENT_AMBULATORY_CARE_PROVIDER_SITE_OTHER): Payer: Medicare PPO | Admitting: Internal Medicine

## 2011-08-04 ENCOUNTER — Encounter: Payer: Self-pay | Admitting: Internal Medicine

## 2011-08-04 VITALS — BP 116/70 | HR 74 | Temp 97.8°F | Ht 69.0 in | Wt 189.0 lb

## 2011-08-04 DIAGNOSIS — R0602 Shortness of breath: Secondary | ICD-10-CM

## 2011-08-04 LAB — PULMONARY FUNCTION TEST

## 2011-08-04 NOTE — Assessment & Plan Note (Signed)
-    Walked one lap @ 185 stopped due to  desat to 87 RA  08/15/10    - PFT's 10/01/2010  FEV1  1.98 (59%) ratio 82 and DLC0  36% corrects to 82    - PFT's 08/04/2011  FEV1 2.15 (65%) ratio 80 and no better with B2,  DLCO 47%    - 08/04/2011  Walked RA x 3 laps @ 185 ft each stopped due to  End of study no desat  Dyspnea has resolved and pft's improved p rx for HIV and presumed low grade PCP so no further pulmonary f/u needed

## 2011-08-04 NOTE — Patient Instructions (Signed)
To get the most out of exercise, you need to be continuously aware that you are short of breath, but never out of breath, for 30 minutes daily. As you improve, it will actually be easier for you to do the same amount of exercise  in  30 minutes so always push to the level where you are short of breath.   If loosing ground with exercise tolerance we need to see you back here, otherwise no regular follow up is needed

## 2011-08-04 NOTE — Progress Notes (Signed)
PFT done today. 

## 2011-08-04 NOTE — Progress Notes (Signed)
Subjective:     Patient ID: Paul [name], male   DOB: 06-19-1955    MRN: [id number]   Brief patient profile:  55 yobm quit smoking around 2002 with no resp problems then around 2007 sob much worse for sev weeks/months eventually  better not sure what rx but not eval by pulmonary or on pulmonary meds then that he recalls,  then recurrent indolent onset progressive doe x sev months  while not any meds for HIV and referred to pulmonary clinic by [facility] for eval 07/2010  With pft's showing restrictive changes 09/2010   HPI 08/15/2010 Initial pulmonary office eval   cc sob x sev months with exertion (walking flat x 50 ft) assoc with intermittent dry cough, still not back on HIV meds due to insurance issues, with last CD4 count 60 07/15/10 but no fever on prophylactic dose of bactrim. Sleeping ok without nocturnal  or early am exac of resp c/o's or need for noct saba.  rec You need to start your medications as soon as possible because you may have a low grade form of pcp pneumonia  10/01/2010 ov/Ples Trudel cc breathing and cough better on hiv rx. Here for f/u pfts. Not really limited by sob though no longer aerobically active. No purulent or excess sputum rec Return to this clinic if you feel you are losing any ground over the next 3 months in terms of your activity tolerance, but you should gradually improve to your satisfaction by the end of 3 months, if not please return here   06/23/2011 f/u ov/Spirit Wernli cc much worse sob with "cold" x 3 months but now improving not using any inhalers, spont improved s rx and no longer coughing, no report of purulent sputum. rec No change rx, pft's on return  08/04/2011 f/u ov/Rylin Saez cc doe x 10 min treadmill 2 pm varying grade, minimal noct cough x a few min, not using any inhalers at all at this point  Sleeping ok without nocturnal  or early am exacerbation  of respiratory  c/o's or need for noct saba. Also denies any obvious fluctuation of symptoms with weather or  environmental changes or other aggravating or alleviating factors except as outlined above   ROS  At present neg for  any significant sore throat, dysphagia, dental problems, itching, sneezing,  nasal congestion or excess/ purulent secretions, ear ache,   fever, chills, sweats, unintended wt loss, pleuritic or exertional cp, hemoptysis, palpitations, orthopnea pnd or leg swelling.  Also denies presyncope, palpitations, heartburn, abdominal pain, anorexia, nausea, vomiting, diarrhea  or change in bowel or urinary habits, change in stools or urine, dysuria,hematuria,  rash, arthralgias, visual complaints, headache, numbness weakness or ataxia or problems with walking or coordination. No noted change in mood/affect or memory.            Objective:   Physical Exam    pleasant bm amb nad   Wt 188 08/15/2010  > 189 10/01/2010 > 06/23/2011 215 > 08/04/2011  189 HEENT mild turbinate edema.  Oropharynx no thrush or excess pnd or cobblestoning.  No JVD or cervical adenopathy. Mild accessory muscle hypertrophy. Trachea midline, nl thryroid. Chest was hyperinflated by percussion with diminished breath sounds and moderate increased exp time without wheeze. Hoover sign positive at mid inspiration. Regular rate and rhythm without murmur gallop or rub or increase P2 or edema.  Abd: no hsm, nl excursion. Ext warm without cyanosis or clubbing.     06/04/11 cxr Stable chronic asymmetric left lung disease as described.  No acute  cardiopulmonary process.        Assessment:         Plan:

## 2011-08-07 ENCOUNTER — Other Ambulatory Visit: Payer: Self-pay | Admitting: Infectious Diseases

## 2011-08-28 ENCOUNTER — Other Ambulatory Visit: Payer: Medicare PPO

## 2011-08-28 DIAGNOSIS — B20 Human immunodeficiency virus [HIV] disease: Secondary | ICD-10-CM

## 2011-08-28 LAB — COMPLETE METABOLIC PANEL WITH GFR
Albumin: 4.4 g/dL (ref 3.5–5.2)
Alkaline Phosphatase: 73 U/L (ref 39–117)
CO2: 22 mEq/L (ref 19–32)
Calcium: 9.8 mg/dL (ref 8.4–10.5)
Chloride: 105 mEq/L (ref 96–112)
GFR, Est Non African American: 57 mL/min — ABNORMAL LOW
Glucose, Bld: 101 mg/dL — ABNORMAL HIGH (ref 70–99)
Potassium: 4.3 mEq/L (ref 3.5–5.3)
Sodium: 137 mEq/L (ref 135–145)
Total Protein: 7.8 g/dL (ref 6.0–8.3)

## 2011-08-28 LAB — CBC WITH DIFFERENTIAL/PLATELET
Basophils Absolute: 0 10*3/uL (ref 0.0–0.1)
Basophils Relative: 1 % (ref 0–1)
Eosinophils Relative: 7 % — ABNORMAL HIGH (ref 0–5)
HCT: 39.9 % (ref 39.0–52.0)
MCHC: 34.6 g/dL (ref 30.0–36.0)
MCV: 107 fL — ABNORMAL HIGH (ref 78.0–100.0)
Monocytes Absolute: 0.4 10*3/uL (ref 0.1–1.0)
Neutro Abs: 2 10*3/uL (ref 1.7–7.7)
Platelets: 275 10*3/uL (ref 150–400)
RDW: 14.8 % (ref 11.5–15.5)

## 2011-08-29 LAB — LIPID PANEL
LDL Cholesterol: 103 mg/dL — ABNORMAL HIGH (ref 0–99)
VLDL: 17 mg/dL (ref 0–40)

## 2011-08-29 LAB — T-HELPER CELL (CD4) - (RCID CLINIC ONLY): CD4 % Helper T Cell: 13 % — ABNORMAL LOW (ref 33–55)

## 2011-09-11 ENCOUNTER — Ambulatory Visit (INDEPENDENT_AMBULATORY_CARE_PROVIDER_SITE_OTHER): Payer: Medicare PPO | Admitting: Infectious Disease

## 2011-09-11 ENCOUNTER — Encounter: Payer: Self-pay | Admitting: Infectious Disease

## 2011-09-11 VITALS — BP 115/73 | HR 81 | Temp 98.5°F | Ht 69.0 in | Wt 204.0 lb

## 2011-09-11 DIAGNOSIS — B20 Human immunodeficiency virus [HIV] disease: Secondary | ICD-10-CM

## 2011-09-11 DIAGNOSIS — R6510 Systemic inflammatory response syndrome (SIRS) of non-infectious origin without acute organ dysfunction: Secondary | ICD-10-CM

## 2011-09-11 DIAGNOSIS — Z0000 Encounter for general adult medical examination without abnormal findings: Secondary | ICD-10-CM | POA: Insufficient documentation

## 2011-09-11 DIAGNOSIS — B0052 Herpesviral keratitis: Secondary | ICD-10-CM

## 2011-09-11 DIAGNOSIS — D893 Immune reconstitution syndrome: Secondary | ICD-10-CM

## 2011-09-11 DIAGNOSIS — I5030 Unspecified diastolic (congestive) heart failure: Secondary | ICD-10-CM

## 2011-09-11 DIAGNOSIS — R0602 Shortness of breath: Secondary | ICD-10-CM

## 2011-09-11 MED ORDER — FERROUS SULFATE 325 (65 FE) MG PO TBEC: DELAYED_RELEASE_TABLET | ORAL | Status: AC

## 2011-09-11 NOTE — Assessment & Plan Note (Signed)
On lasix. He will bring bottles to clarify dose

## 2011-09-11 NOTE — Assessment & Plan Note (Signed)
Perfect control 

## 2011-09-11 NOTE — Assessment & Plan Note (Signed)
Followed by Dr. Sherene Sires . Much improved.

## 2011-09-11 NOTE — Assessment & Plan Note (Signed)
resolved 

## 2011-09-11 NOTE — Assessment & Plan Note (Signed)
On suppressive valtrex. IRIS seems resolved

## 2011-09-11 NOTE — Progress Notes (Signed)
  Subjective:    Patient ID: Paul [name], male    DOB: 14-Dec-1955, 56 y.o.   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 He has  Seen Dr. Sherene Sires and repeat PFTs showed improvement in his PFTs. His CD4 continues to climb. He has seen his optho and eyes have NOT had evidence of recurrence. We have recommended that he also have a primary care MD in addition to myself and he would like to be seen by [facility] if possible given his positive experience with [facility] and we are making a referral for primary care. His DOE has improved. He attributes this to his lasix though he only had minimal diastolic dysfunction  On his echo. Dr. Sherene Sires believes improvement is secondary to  Improvement in rx of HIV potentially.   Review of Systems  Constitutional: Negative for fever, chills, diaphoresis, activity change, appetite change, fatigue and unexpected weight change.  HENT: Negative for congestion, sore throat, rhinorrhea, sneezing, trouble swallowing and sinus pressure.   Eyes: Negative for photophobia and visual disturbance.  Respiratory: Negative for cough, chest tightness, shortness of breath, wheezing and stridor.   Cardiovascular: Negative for chest pain, palpitations and leg swelling.  Gastrointestinal: Negative for nausea, vomiting, abdominal pain, diarrhea, constipation, blood in stool, abdominal distention and anal bleeding.  Genitourinary: Negative for dysuria, hematuria, flank pain and difficulty urinating.  Musculoskeletal: Negative for myalgias, back pain, joint swelling, arthralgias and gait problem.  Skin: Negative for color change, pallor, rash and wound.  Neurological: Negative for dizziness, tremors, weakness and light-headedness.  Hematological: Negative for adenopathy. Does not bruise/bleed easily.    Psychiatric/Behavioral: Negative for behavioral problems, confusion, disturbed wake/sleep cycle, dysphoric mood, decreased concentration and agitation.       Objective:   Physical Exam  Constitutional: He is oriented to person, place, and time. He appears well-developed and well-nourished. No distress.  HENT:  Head: Normocephalic and atraumatic.  Mouth/Throat: Oropharynx is clear and moist. No oropharyngeal exudate.  Eyes: Conjunctivae and EOM are normal. Pupils are equal, round, and reactive to light. No scleral icterus.    Neck: Normal range of motion. Neck supple. No JVD present.  Cardiovascular: Normal rate, regular rhythm and normal heart sounds.  Exam reveals no gallop and no friction rub.   No murmur heard. Pulmonary/Chest: Effort normal and breath sounds normal. No respiratory distress. He has no wheezes. He has no rales. He exhibits no tenderness.  Abdominal: He exhibits no distension and no mass. There is no tenderness. There is no rebound and no guarding.  Musculoskeletal: He exhibits no edema and no tenderness.  Lymphadenopathy:    He has no cervical adenopathy.  Neurological: He is alert and oriented to person, place, and time. He has normal reflexes. He exhibits normal muscle tone. Coordination normal.  Skin: Skin is warm and dry. He is not diaphoretic. No erythema. No pallor.  Psychiatric: He has a normal mood and affect. His behavior is normal. Judgment and thought content normal.          Assessment & Plan:  HIV DISEASE Perfect control!  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 IRIS seems resolved  IRIS (immune reconstitution inflammatory syndrome) resolved  SOB Followed by Dr. Sherene Sires . Much improved.  Diastolic heart failure On lasix. He will bring bottles to clarify dose

## 2011-09-12 ENCOUNTER — Encounter: Payer: Self-pay | Admitting: Licensed Clinical Social Worker

## 2011-09-22 ENCOUNTER — Other Ambulatory Visit: Payer: Self-pay | Admitting: Licensed Clinical Social Worker

## 2011-09-22 DIAGNOSIS — B20 Human immunodeficiency virus [HIV] disease: Secondary | ICD-10-CM

## 2011-09-22 MED ORDER — SULFAMETHOXAZOLE-TMP DS 800-160 MG PO TABS: 1.0000 | ORAL_TABLET | Freq: Two times a day (BID) | ORAL | Status: DC

## 2011-10-06 ENCOUNTER — Other Ambulatory Visit: Payer: Self-pay | Admitting: Licensed Clinical Social Worker

## 2011-10-06 DIAGNOSIS — I5030 Unspecified diastolic (congestive) heart failure: Secondary | ICD-10-CM

## 2011-10-06 MED ORDER — FUROSEMIDE 40 MG PO TABS: 40.0000 mg | ORAL_TABLET | Freq: Every day | ORAL | Status: DC

## 2011-10-27 ENCOUNTER — Other Ambulatory Visit: Payer: Self-pay | Admitting: *Deleted

## 2011-10-27 DIAGNOSIS — B20 Human immunodeficiency virus [HIV] disease: Secondary | ICD-10-CM

## 2011-10-27 MED ORDER — LAMIVUDINE-ZIDOVUDINE 150-300 MG PO TABS: 1.0000 | ORAL_TABLET | Freq: Two times a day (BID) | ORAL | Status: DC

## 2011-10-27 MED ORDER — ETRAVIRINE 200 MG PO TABS: 200.0000 mg | ORAL_TABLET | Freq: Two times a day (BID) | ORAL | Status: DC

## 2011-10-27 MED ORDER — RALTEGRAVIR POTASSIUM 400 MG PO TABS: 400.0000 mg | ORAL_TABLET | Freq: Two times a day (BID) | ORAL | Status: DC

## 2011-12-23 ENCOUNTER — Other Ambulatory Visit: Payer: Self-pay | Admitting: Infectious Disease

## 2011-12-23 DIAGNOSIS — G4700 Insomnia, unspecified: Secondary | ICD-10-CM

## 2011-12-29 ENCOUNTER — Other Ambulatory Visit: Payer: Medicare PPO

## 2011-12-29 ENCOUNTER — Other Ambulatory Visit: Payer: Self-pay | Admitting: *Deleted

## 2011-12-29 DIAGNOSIS — B20 Human immunodeficiency virus [HIV] disease: Secondary | ICD-10-CM

## 2011-12-29 LAB — CBC WITH DIFFERENTIAL/PLATELET
Eosinophils Relative: 5 % (ref 0–5)
HCT: 44 % (ref 39.0–52.0)
Hemoglobin: 15.7 g/dL (ref 13.0–17.0)
Lymphocytes Relative: 25 % (ref 12–46)
Lymphs Abs: 1.3 10*3/uL (ref 0.7–4.0)
MCV: 105 fL — ABNORMAL HIGH (ref 78.0–100.0)
Monocytes Absolute: 0.5 10*3/uL (ref 0.1–1.0)
Monocytes Relative: 10 % (ref 3–12)
RBC: 4.19 MIL/uL — ABNORMAL LOW (ref 4.22–5.81)
RDW: 13.9 % (ref 11.5–15.5)
WBC: 5.2 10*3/uL (ref 4.0–10.5)

## 2011-12-29 LAB — COMPLETE METABOLIC PANEL WITH GFR
ALT: 20 U/L (ref 0–53)
Albumin: 4.4 g/dL (ref 3.5–5.2)
CO2: 25 mEq/L (ref 19–32)
Chloride: 102 mEq/L (ref 96–112)
GFR, Est African American: 87 mL/min
GFR, Est Non African American: 75 mL/min
Glucose, Bld: 112 mg/dL — ABNORMAL HIGH (ref 70–99)
Potassium: 4.3 mEq/L (ref 3.5–5.3)
Sodium: 139 mEq/L (ref 135–145)
Total Protein: 7.5 g/dL (ref 6.0–8.3)

## 2011-12-29 MED ORDER — ETRAVIRINE 200 MG PO TABS: 200.0000 mg | ORAL_TABLET | Freq: Two times a day (BID) | ORAL | Status: DC

## 2011-12-30 LAB — T-HELPER CELL (CD4) - (RCID CLINIC ONLY): CD4 T Cell Abs: 160 uL — ABNORMAL LOW (ref 400–2700)

## 2011-12-30 LAB — RPR

## 2011-12-31 LAB — LIPID PANEL: LDL Cholesterol: 108 mg/dL — ABNORMAL HIGH (ref 0–99)

## 2012-01-12 ENCOUNTER — Ambulatory Visit: Payer: Medicare PPO | Admitting: Infectious Disease

## 2012-01-12 IMAGING — CR DG CHEST 2V
2 series · 2 of 2 positions shown · non-contrast
Comparison: Radiographs 01/09/2009 and CT 01/10/2009.

CLINICAL DATA: Progressive shortness of breath over the last week.
History of Pneumocystis pneumonia.

CHEST - 2 VIEW

[w chest pa]
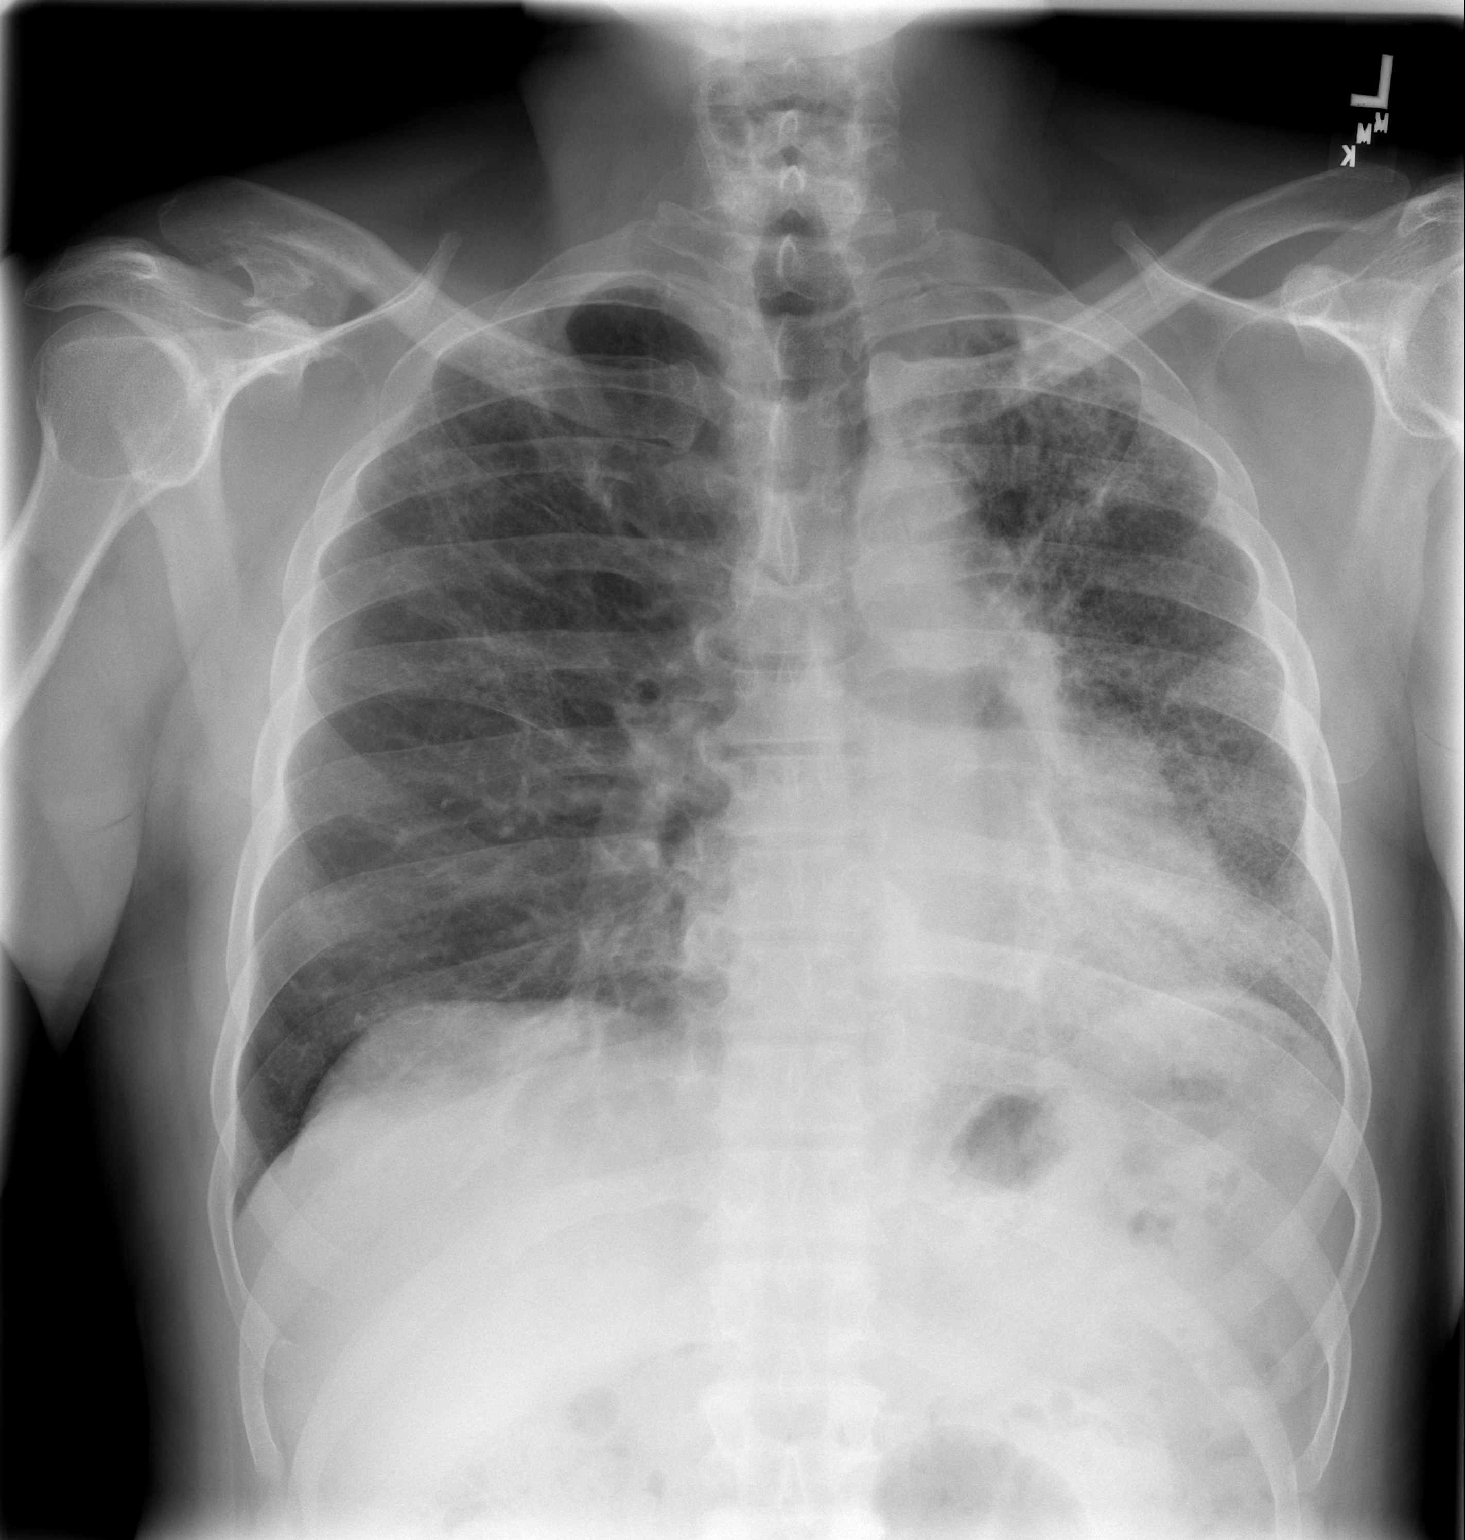

[w chest lat]
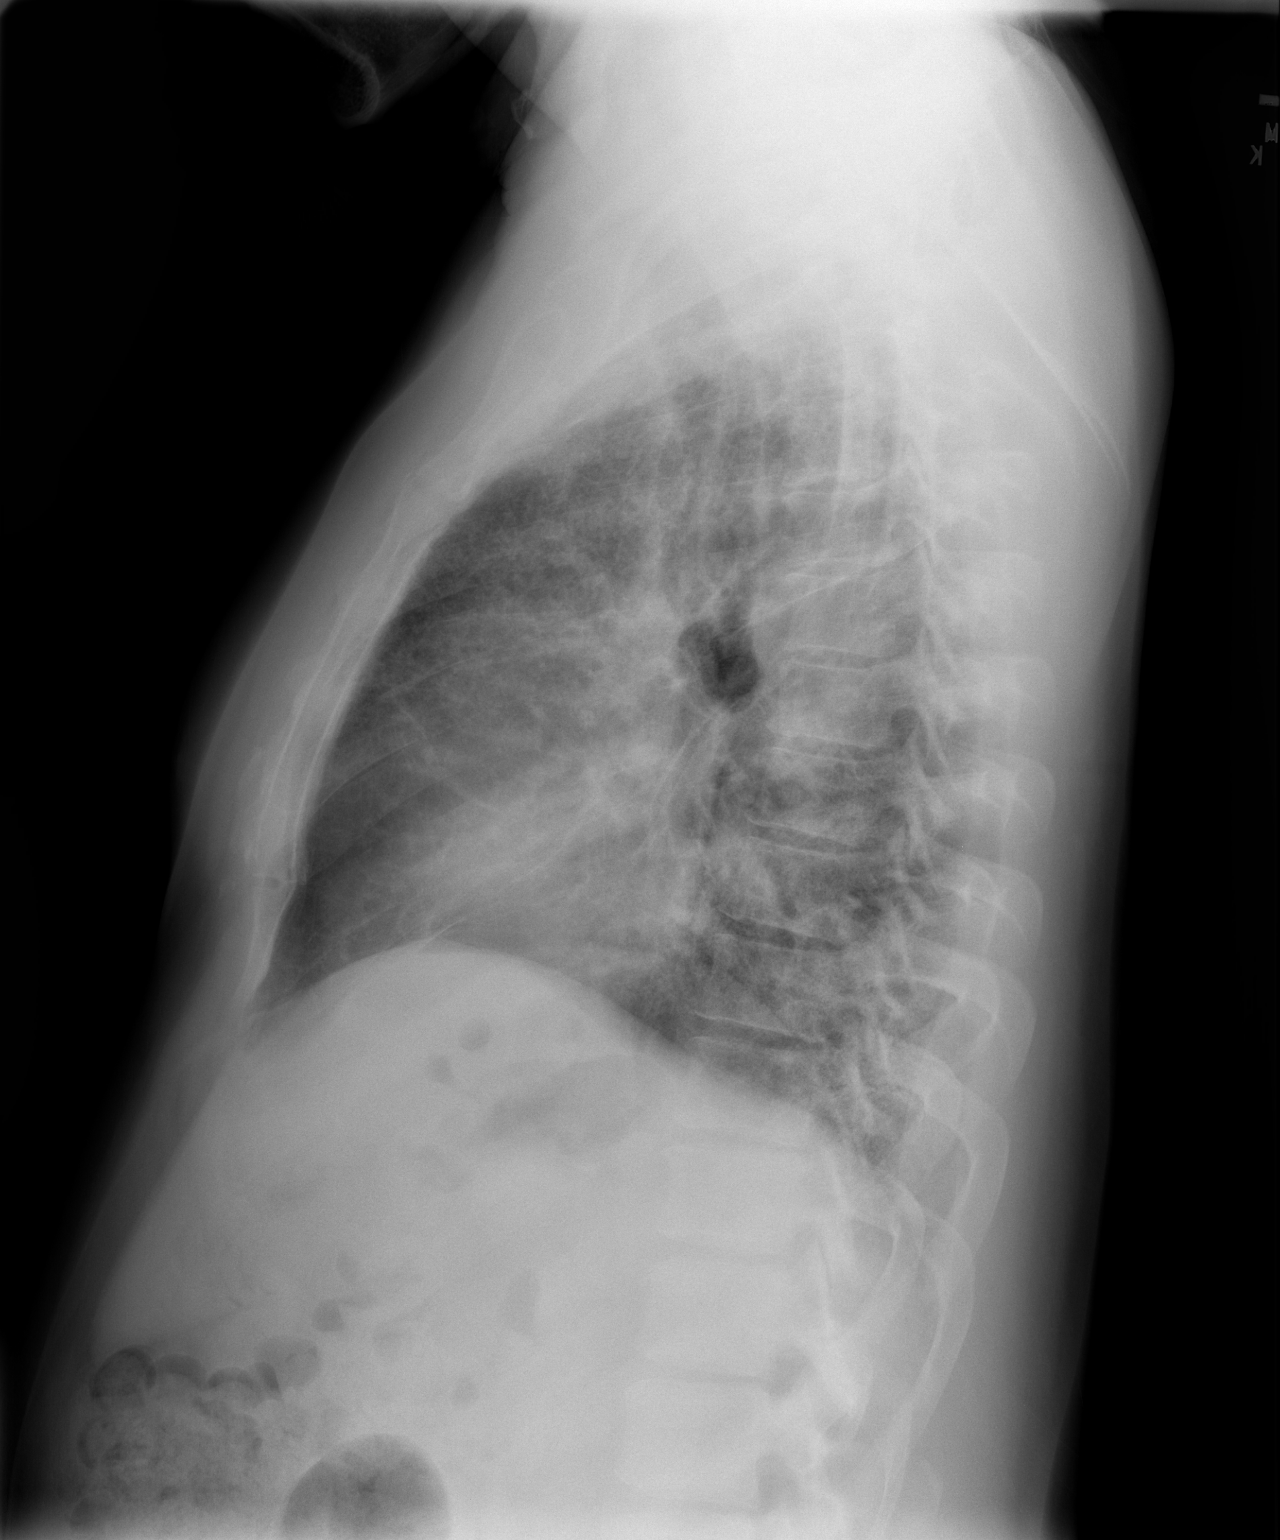

[2 of 2 positions shown; findings below may reference images not displayed]

FINDINGS: The heart size and mediastinal contours are stable.
There is stable volume loss in the left hemithorax with stable
asymmetric left greater than right fibrotic changes.  No
superimposed airspace disease, pleural effusion or pneumothorax is
identified.  Osseous structures appear unchanged.
IMPRESSION: Stable chronic lung disease with volume loss and asymmetric
fibrosis on the left.  No acute superimposed findings identified.

## 2012-01-13 ENCOUNTER — Other Ambulatory Visit: Payer: Self-pay | Admitting: Licensed Clinical Social Worker

## 2012-01-13 ENCOUNTER — Encounter: Payer: Self-pay | Admitting: Infectious Disease

## 2012-01-13 ENCOUNTER — Ambulatory Visit (INDEPENDENT_AMBULATORY_CARE_PROVIDER_SITE_OTHER): Payer: Medicare PPO | Admitting: Infectious Disease

## 2012-01-13 VITALS — BP 130/90 | HR 77 | Temp 97.7°F | Ht 69.0 in | Wt 198.0 lb

## 2012-01-13 DIAGNOSIS — B20 Human immunodeficiency virus [HIV] disease: Secondary | ICD-10-CM

## 2012-01-13 DIAGNOSIS — R6510 Systemic inflammatory response syndrome (SIRS) of non-infectious origin without acute organ dysfunction: Secondary | ICD-10-CM

## 2012-01-13 DIAGNOSIS — H049 Disorder of lacrimal system, unspecified: Secondary | ICD-10-CM

## 2012-01-13 DIAGNOSIS — D893 Immune reconstitution syndrome: Secondary | ICD-10-CM

## 2012-01-13 DIAGNOSIS — I5030 Unspecified diastolic (congestive) heart failure: Secondary | ICD-10-CM

## 2012-01-13 DIAGNOSIS — H169 Unspecified keratitis: Secondary | ICD-10-CM

## 2012-01-13 DIAGNOSIS — B009 Herpesviral infection, unspecified: Secondary | ICD-10-CM

## 2012-01-13 DIAGNOSIS — Z23 Encounter for immunization: Secondary | ICD-10-CM

## 2012-01-13 MED ORDER — VALACYCLOVIR HCL 500 MG PO TABS: 500.0000 mg | ORAL_TABLET | Freq: Every day | ORAL | Status: DC

## 2012-01-13 MED ORDER — FUROSEMIDE 40 MG PO TABS: 40.0000 mg | ORAL_TABLET | Freq: Every day | ORAL | Status: DC

## 2012-01-13 NOTE — Progress Notes (Signed)
  Subjective:    Patient ID: Paul [name], male    DOB: 13-Oct-1955, 56 y.o.   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 Paul [name] does continue to have problems  With excessive tear production in right eye and his local  bulges has referred him to [facility] for consideration of surgical correction of this tear duct problem. Otherwise he is doing quite well. 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 He has no specific complaints today.  Review of Systems  Constitutional: Negative for fever, chills, diaphoresis, activity change, appetite change, fatigue and unexpected weight change.  HENT: Negative for congestion, sore throat, rhinorrhea, sneezing, trouble swallowing and sinus pressure.   Eyes: Negative for photophobia and visual disturbance.  Respiratory: Negative for cough, chest tightness, shortness of breath, wheezing and stridor.   Cardiovascular: Negative for chest pain, palpitations and leg swelling.  Gastrointestinal: Negative for nausea, vomiting, abdominal pain, diarrhea, constipation, blood in stool, abdominal distention and anal bleeding.  Genitourinary: Negative for dysuria, hematuria, flank pain and difficulty urinating.  Musculoskeletal: Negative for myalgias, back pain, joint swelling, arthralgias and gait problem.  Skin: Negative for color change, pallor, rash and wound.  Neurological: Negative for dizziness, tremors, weakness and light-headedness.  Hematological: Negative for adenopathy. Does not bruise/bleed easily.  Psychiatric/Behavioral: Negative for behavioral problems, confusion, disturbed wake/sleep cycle, dysphoric mood, decreased concentration and agitation.       Objective:   Physical Exam  Constitutional: He is oriented to person, place, and  time. He appears well-developed and well-nourished. No distress.  HENT:  Head: Normocephalic and atraumatic.  Mouth/Throat: Oropharynx is clear and moist. No oropharyngeal exudate.  Eyes: Conjunctivae normal and EOM are normal. Pupils are equal, round, and reactive to light. No scleral icterus.    Neck: Normal range of motion. Neck supple. No JVD present.  Cardiovascular: Normal rate, regular rhythm and normal heart sounds.  Exam reveals no gallop and no friction rub.   No murmur heard. Pulmonary/Chest: Effort normal and breath sounds normal. No respiratory distress. He has no wheezes. He has no rales. He exhibits no tenderness.  Abdominal: He exhibits no distension and no mass. There is no tenderness. There is no rebound and no guarding.  Musculoskeletal: He exhibits no edema and no tenderness.  Lymphadenopathy:    He has no cervical adenopathy.  Neurological: He is alert and oriented to person, place, and time. He has normal reflexes. He exhibits normal muscle tone. Coordination normal.  Skin: Skin is warm and dry. He is not diaphoretic. No erythema. No pallor.  Psychiatric: He has a normal mood and affect. His behavior is normal. Judgment and thought content normal.          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  #4: T[facility] duct problem [facility] this.

## 2012-01-29 ENCOUNTER — Other Ambulatory Visit: Payer: Self-pay | Admitting: *Deleted

## 2012-01-29 DIAGNOSIS — B20 Human immunodeficiency virus [HIV] disease: Secondary | ICD-10-CM

## 2012-01-29 DIAGNOSIS — I5030 Unspecified diastolic (congestive) heart failure: Secondary | ICD-10-CM

## 2012-01-29 MED ORDER — SULFAMETHOXAZOLE-TMP DS 800-160 MG PO TABS: 1.0000 | ORAL_TABLET | Freq: Two times a day (BID) | ORAL | Status: DC

## 2012-01-29 MED ORDER — POTASSIUM CHLORIDE CRYS ER 10 MEQ PO TBCR: 10.0000 meq | EXTENDED_RELEASE_TABLET | Freq: Every day | ORAL | Status: DC

## 2012-01-29 MED ORDER — FUROSEMIDE 40 MG PO TABS: 40.0000 mg | ORAL_TABLET | Freq: Every day | ORAL | Status: DC

## 2012-04-09 ENCOUNTER — Other Ambulatory Visit: Payer: Self-pay | Admitting: Infectious Disease

## 2012-04-14 ENCOUNTER — Other Ambulatory Visit: Payer: Self-pay | Admitting: *Deleted

## 2012-04-14 DIAGNOSIS — B20 Human immunodeficiency virus [HIV] disease: Secondary | ICD-10-CM

## 2012-04-14 MED ORDER — RALTEGRAVIR POTASSIUM 400 MG PO TABS: 400.0000 mg | ORAL_TABLET | Freq: Two times a day (BID) | ORAL | Status: DC

## 2012-04-14 MED ORDER — LAMIVUDINE-ZIDOVUDINE 150-300 MG PO TABS: 1.0000 | ORAL_TABLET | Freq: Two times a day (BID) | ORAL | Status: DC

## 2012-05-03 ENCOUNTER — Other Ambulatory Visit: Payer: Self-pay | Admitting: Licensed Clinical Social Worker

## 2012-05-03 DIAGNOSIS — B20 Human immunodeficiency virus [HIV] disease: Secondary | ICD-10-CM

## 2012-05-03 MED ORDER — SULFAMETHOXAZOLE-TMP DS 800-160 MG PO TABS: 1.0000 | ORAL_TABLET | Freq: Two times a day (BID) | ORAL | Status: DC

## 2012-05-03 MED ORDER — RALTEGRAVIR POTASSIUM 400 MG PO TABS: 400.0000 mg | ORAL_TABLET | Freq: Two times a day (BID) | ORAL | Status: DC

## 2012-05-03 MED ORDER — LAMIVUDINE-ZIDOVUDINE 150-300 MG PO TABS: 1.0000 | ORAL_TABLET | Freq: Two times a day (BID) | ORAL | Status: DC

## 2012-07-05 ENCOUNTER — Other Ambulatory Visit: Payer: Self-pay | Admitting: Licensed Clinical Social Worker

## 2012-07-05 ENCOUNTER — Encounter: Payer: Self-pay | Admitting: Licensed Clinical Social Worker

## 2012-07-05 DIAGNOSIS — B20 Human immunodeficiency virus [HIV] disease: Secondary | ICD-10-CM

## 2012-07-05 MED ORDER — ETRAVIRINE 200 MG PO TABS: 200.0000 mg | ORAL_TABLET | Freq: Two times a day (BID) | ORAL | Status: DC

## 2012-07-07 ENCOUNTER — Other Ambulatory Visit: Payer: Self-pay | Admitting: Licensed Clinical Social Worker

## 2012-07-07 DIAGNOSIS — G4700 Insomnia, unspecified: Secondary | ICD-10-CM

## 2012-07-07 MED ORDER — ZOLPIDEM TARTRATE 10 MG PO TABS: 10.0000 mg | ORAL_TABLET | Freq: Every evening | ORAL | Status: DC | PRN

## 2012-07-12 ENCOUNTER — Other Ambulatory Visit: Payer: Self-pay | Admitting: Infectious Diseases

## 2012-07-12 ENCOUNTER — Other Ambulatory Visit: Payer: Medicare PPO

## 2012-07-12 DIAGNOSIS — B20 Human immunodeficiency virus [HIV] disease: Secondary | ICD-10-CM

## 2012-07-12 DIAGNOSIS — Z113 Encounter for screening for infections with a predominantly sexual mode of transmission: Secondary | ICD-10-CM

## 2012-07-12 LAB — COMPLETE METABOLIC PANEL WITH GFR
Albumin: 4.4 g/dL (ref 3.5–5.2)
BUN: 11 mg/dL (ref 6–23)
Calcium: 9.9 mg/dL (ref 8.4–10.5)
Chloride: 100 mEq/L (ref 96–112)
GFR, Est Non African American: 77 mL/min
Glucose, Bld: 98 mg/dL (ref 70–99)
Potassium: 4.5 mEq/L (ref 3.5–5.3)

## 2012-07-12 LAB — CBC WITH DIFFERENTIAL/PLATELET
Basophils Absolute: 0 10*3/uL (ref 0.0–0.1)
Eosinophils Relative: 2 % (ref 0–5)
Lymphocytes Relative: 26 % (ref 12–46)
Neutro Abs: 2.9 10*3/uL (ref 1.7–7.7)
Platelets: 247 10*3/uL (ref 150–400)
RDW: 15.7 % — ABNORMAL HIGH (ref 11.5–15.5)
WBC: 4.6 10*3/uL (ref 4.0–10.5)

## 2012-07-12 LAB — RPR

## 2012-07-12 LAB — LIPID PANEL: HDL: 56 mg/dL (ref >39)

## 2012-07-12 NOTE — Addendum Note (Signed)
Addended bySteva Colder on: 07/12/2012 10:31 AM   Modules accepted: Orders

## 2012-07-13 LAB — T-HELPER CELL (CD4) - (RCID CLINIC ONLY): CD4 T Cell Abs: 180 uL — ABNORMAL LOW (ref 400–2700)

## 2012-07-15 ENCOUNTER — Other Ambulatory Visit: Payer: Self-pay | Admitting: *Deleted

## 2012-07-15 DIAGNOSIS — I5030 Unspecified diastolic (congestive) heart failure: Secondary | ICD-10-CM

## 2012-07-15 MED ORDER — POTASSIUM CHLORIDE CRYS ER 10 MEQ PO TBCR: 10.0000 meq | EXTENDED_RELEASE_TABLET | Freq: Every day | ORAL | Status: DC

## 2012-07-21 IMAGING — CR DG CHEST 2V
2 series · 2 of 2 positions shown · non-contrast
Comparison: Chest radiographs 07/29/2010 and 10/01/2010.  CT
01/10/2009.

CLINICAL DATA: Ex-smoker with dyspnea on exertion.

CHEST - 2 VIEW

[w chest pa *]
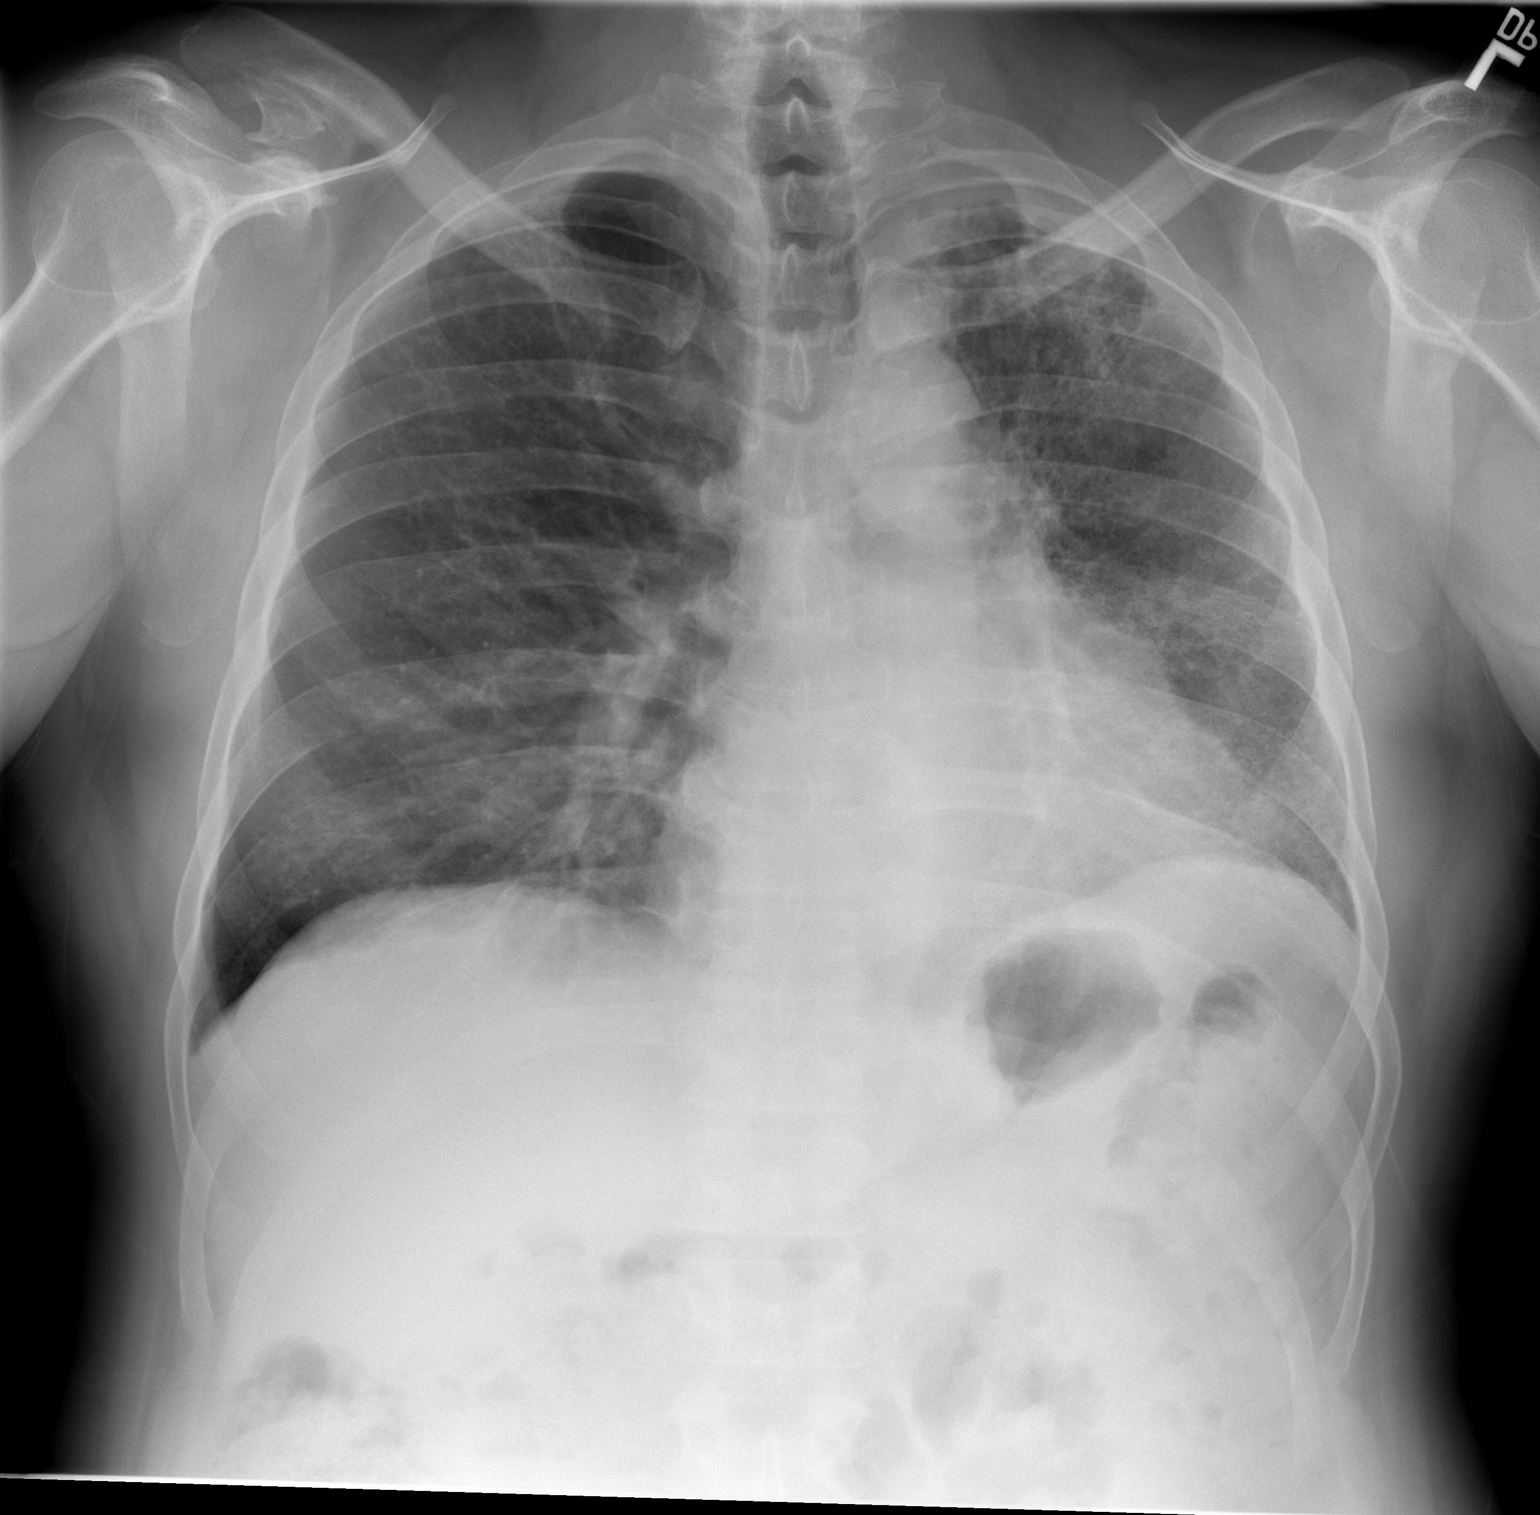

[w chest lat]
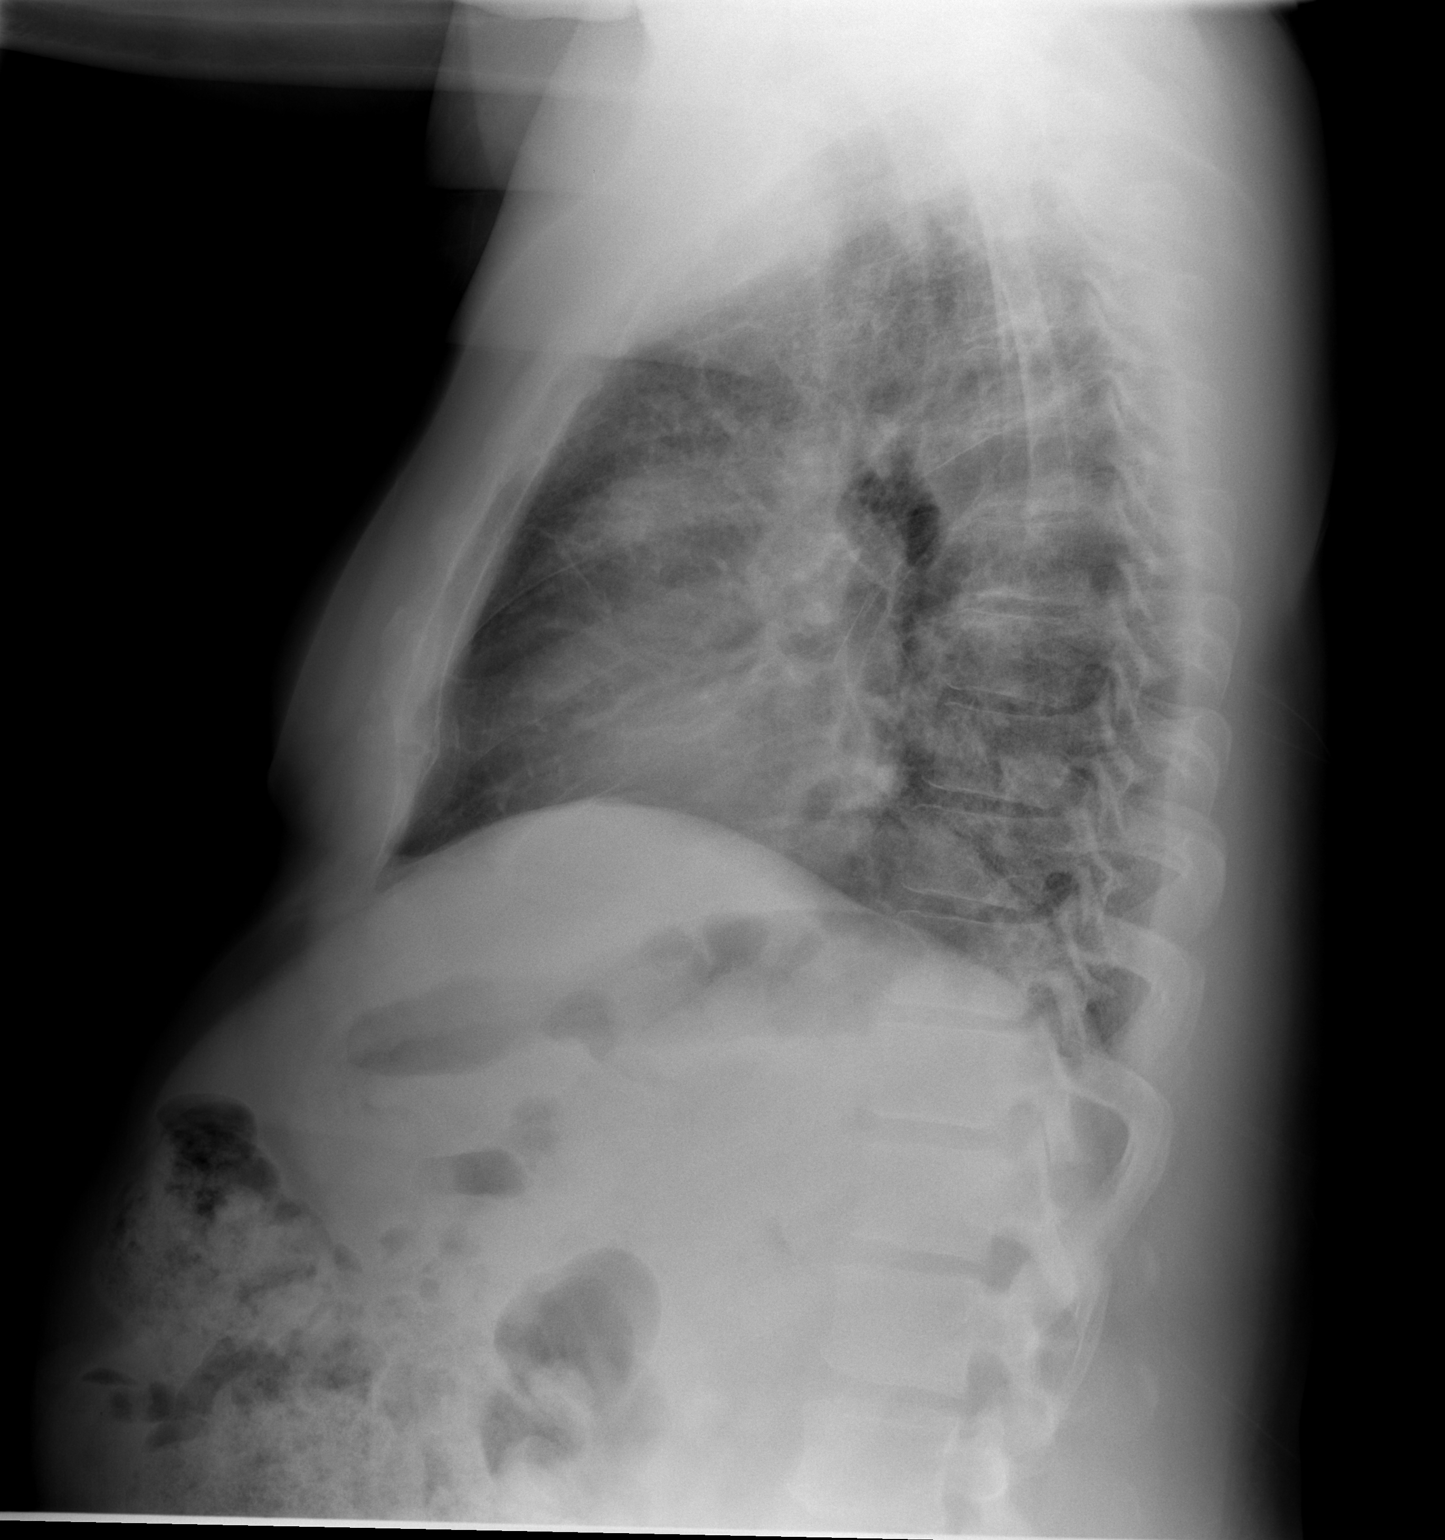

[2 of 2 positions shown; findings below may reference images not displayed]

FINDINGS: The heart size and mediastinal contours are stable.
There is stable volume loss in the left hemithorax associated with
asymmetric fibrotic changes throughout the left lung.  Peripheral
right upper lobe scarring appears stable.  There is no new airspace
disease or pleural effusion.  Chronic changes involving the right
coracoclavicular ligament are stable.  No acute osseous findings
are seen.
IMPRESSION: Stable examination with chronic lung disease as described.  No
acute cardiopulmonary process.

## 2012-07-26 ENCOUNTER — Ambulatory Visit (INDEPENDENT_AMBULATORY_CARE_PROVIDER_SITE_OTHER): Payer: Medicare PPO | Admitting: Infectious Disease

## 2012-07-26 ENCOUNTER — Other Ambulatory Visit: Payer: Self-pay | Admitting: Licensed Clinical Social Worker

## 2012-07-26 ENCOUNTER — Encounter: Payer: Self-pay | Admitting: Infectious Disease

## 2012-07-26 VITALS — BP 111/69 | HR 85 | Temp 97.7°F | Ht 69.0 in | Wt 189.0 lb

## 2012-07-26 DIAGNOSIS — D893 Immune reconstitution syndrome: Secondary | ICD-10-CM

## 2012-07-26 DIAGNOSIS — B0052 Herpesviral keratitis: Secondary | ICD-10-CM

## 2012-07-26 DIAGNOSIS — B20 Human immunodeficiency virus [HIV] disease: Secondary | ICD-10-CM

## 2012-07-26 DIAGNOSIS — H04209 Unspecified epiphora, unspecified lacrimal gland: Secondary | ICD-10-CM

## 2012-07-26 DIAGNOSIS — R6510 Systemic inflammatory response syndrome (SIRS) of non-infectious origin without acute organ dysfunction: Secondary | ICD-10-CM

## 2012-07-26 DIAGNOSIS — H04203 Unspecified epiphora, bilateral lacrimal glands: Secondary | ICD-10-CM

## 2012-07-26 DIAGNOSIS — E881 Lipodystrophy, not elsewhere classified: Secondary | ICD-10-CM

## 2012-07-26 DIAGNOSIS — F32A Depression, unspecified: Secondary | ICD-10-CM

## 2012-07-26 DIAGNOSIS — F329 Major depressive disorder, single episode, unspecified: Secondary | ICD-10-CM

## 2012-07-26 LAB — TESTOSTERONE: Testosterone: 397 ng/dL (ref 300–890)

## 2012-07-26 MED ORDER — SULFAMETHOXAZOLE-TMP DS 800-160 MG PO TABS: 1.0000 | ORAL_TABLET | Freq: Once | ORAL | Status: DC

## 2012-07-26 MED ORDER — SULFAMETHOXAZOLE-TMP DS 800-160 MG PO TABS: 1.0000 | ORAL_TABLET | Freq: Two times a day (BID) | ORAL | Status: DC

## 2012-07-26 NOTE — Progress Notes (Signed)
Subjective:    Patient ID: Paul [name], male    DOB: 01-23-1956, 57 y.o.   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    Brandn does continue to have problems With excessive tear production in right eye  Due to problems with eyelash with  consideration of surgical correction of this tear duct problem.   He IS going thru stress of break up with his girlfriend who suddenly broke up with him last  Week and is moving out. I offered him counselling with [facility], SSRI anxiolytic but  Does not wish for any of these interventions. He is coping by talking to friends.  He is curious if his testosterone level might be low and also wonders if it is time for new colonoscopy based on [facility] rec.  Review of Systems  Constitutional: Negative for fever, chills, diaphoresis, activity change, appetite change, fatigue and unexpected weight change.  HENT: Negative for congestion, sore throat, rhinorrhea, sneezing, trouble swallowing and sinus pressure.   Eyes: Negative for photophobia and visual disturbance.  Respiratory: Negative for cough, chest tightness, shortness of breath, wheezing and stridor.   Cardiovascular: Negative for chest pain, palpitations and leg swelling.  Gastrointestinal: Negative for nausea, vomiting, abdominal pain, diarrhea, constipation, blood in stool, abdominal distention and anal bleeding.  Genitourinary: Negative for dysuria, hematuria, flank pain and difficulty urinating.  Musculoskeletal: Negative for myalgias, back pain, joint swelling, arthralgias and gait problem.  Skin: Negative for color change, pallor, rash and wound.  Neurological: Negative for dizziness, tremors, weakness and light-headedness.  Hematological: Negative for adenopathy. Does not bruise/bleed easily.    Psychiatric/Behavioral: Negative for behavioral problems, confusion, sleep disturbance, dysphoric mood, decreased concentration and agitation.       Objective:   Physical Exam  Constitutional: He is oriented to person, place, and time. He appears well-developed and well-nourished. No distress.  HENT:  Head: Normocephalic and atraumatic.  Mouth/Throat: Oropharynx is clear and moist. No oropharyngeal exudate.  Eyes: Conjunctivae and EOM are normal. Pupils are equal, round, and reactive to light. No scleral icterus.    Neck: Normal range of motion. Neck supple. No JVD present.  Cardiovascular: Normal rate, regular rhythm and normal heart sounds.  Exam reveals no gallop and no friction rub.   No murmur heard. Pulmonary/Chest: Effort normal and breath sounds normal. No respiratory distress. He has no wheezes. He has no rales. He exhibits no tenderness.  Abdominal: He exhibits no distension and no mass. There is no tenderness. There is no rebound and no guarding.  Musculoskeletal: He exhibits no edema and no tenderness.  Lymphadenopathy:    He has no cervical adenopathy.  Neurological: He is alert and oriented to person, place, and time. He has normal reflexes. He exhibits normal muscle tone. Coordination normal.  Skin: Skin is warm and dry. He is not diaphoretic. No erythema. No pallor.  Psychiatric: He has a normal mood and affect. His behavior is normal. Judgment and thought content normal.          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  #4: Tear duct problem [facility] this.  #5 Breakup, dysphoria: coping, offer is there for counselling ssri  #6 HIV lipodystrophy: check  testosterone  #7 HCM: check results of colonoscopy IF normal then NOT due till 2016

## 2012-10-13 ENCOUNTER — Other Ambulatory Visit (INDEPENDENT_AMBULATORY_CARE_PROVIDER_SITE_OTHER): Payer: Medicare PPO

## 2012-10-13 DIAGNOSIS — B20 Human immunodeficiency virus [HIV] disease: Secondary | ICD-10-CM

## 2012-10-13 LAB — COMPLETE METABOLIC PANEL WITH GFR
Albumin: 4.4 g/dL (ref 3.5–5.2)
BUN: 14 mg/dL (ref 6–23)
Calcium: 9.3 mg/dL (ref 8.4–10.5)
Chloride: 104 mEq/L (ref 96–112)
Creat: 1.2 mg/dL (ref 0.50–1.35)
GFR, Est African American: 77 mL/min
GFR, Est Non African American: 67 mL/min
Glucose, Bld: 103 mg/dL — ABNORMAL HIGH (ref 70–99)
Potassium: 4.4 mEq/L (ref 3.5–5.3)

## 2012-10-13 LAB — CBC WITH DIFFERENTIAL/PLATELET
Basophils Absolute: 0 10*3/uL (ref 0.0–0.1)
Eosinophils Relative: 3 % (ref 0–5)
Lymphocytes Relative: 22 % (ref 12–46)
Neutro Abs: 3.5 10*3/uL (ref 1.7–7.7)
Neutrophils Relative %: 65 % (ref 43–77)
Platelets: 229 10*3/uL (ref 150–400)
RDW: 13.3 % (ref 11.5–15.5)
WBC: 5.4 10*3/uL (ref 4.0–10.5)

## 2012-10-14 LAB — T-HELPER CELL (CD4) - (RCID CLINIC ONLY)
CD4 % Helper T Cell: 14 % — ABNORMAL LOW (ref 33–55)
CD4 T Cell Abs: 180 uL — ABNORMAL LOW (ref 400–2700)

## 2012-10-27 ENCOUNTER — Encounter: Payer: Self-pay | Admitting: Infectious Disease

## 2012-10-27 ENCOUNTER — Ambulatory Visit (INDEPENDENT_AMBULATORY_CARE_PROVIDER_SITE_OTHER): Payer: Medicare PPO | Admitting: Infectious Disease

## 2012-10-27 VITALS — BP 110/72 | HR 62 | Temp 98.2°F | Wt 191.5 lb

## 2012-10-27 DIAGNOSIS — F329 Major depressive disorder, single episode, unspecified: Secondary | ICD-10-CM

## 2012-10-27 DIAGNOSIS — B0052 Herpesviral keratitis: Secondary | ICD-10-CM

## 2012-10-27 DIAGNOSIS — G63 Polyneuropathy in diseases classified elsewhere: Secondary | ICD-10-CM

## 2012-10-27 DIAGNOSIS — B20 Human immunodeficiency virus [HIV] disease: Secondary | ICD-10-CM

## 2012-10-27 DIAGNOSIS — F32A Depression, unspecified: Secondary | ICD-10-CM

## 2012-10-27 DIAGNOSIS — D893 Immune reconstitution syndrome: Secondary | ICD-10-CM

## 2012-10-27 DIAGNOSIS — R202 Paresthesia of skin: Secondary | ICD-10-CM

## 2012-10-27 DIAGNOSIS — R209 Unspecified disturbances of skin sensation: Secondary | ICD-10-CM

## 2012-10-27 DIAGNOSIS — G909 Disorder of the autonomic nervous system, unspecified: Secondary | ICD-10-CM

## 2012-10-27 DIAGNOSIS — R6510 Systemic inflammatory response syndrome (SIRS) of non-infectious origin without acute organ dysfunction: Secondary | ICD-10-CM

## 2012-10-27 MED ORDER — SULFAMETHOXAZOLE-TMP DS 800-160 MG PO TABS: 1.0000 | ORAL_TABLET | ORAL | Status: DC

## 2012-10-27 NOTE — Progress Notes (Signed)
  Subjective:    Patient ID: Paul [name], male    DOB: [birth year], 57 y.o.   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    Paul [name] did continue to have problems With excessive tear production in right eye  Due to problems with eyelash with  consideration of surgical correction of this tear duct problem but this was repaired yesterday.    He IS moving to new apt now and increaing exercise, directing a play, still recovering from break up with girlfriend from June   He has occasional paresthesias in left hand that he associates with shoulder injury.  Review of Systems  Constitutional: Negative for fever, chills, diaphoresis, activity change, appetite change, fatigue and unexpected weight change.  HENT: Negative for congestion, sore throat, rhinorrhea, sneezing, trouble swallowing and sinus pressure.   Eyes: Negative for photophobia and visual disturbance.  Respiratory: Negative for cough, chest tightness, shortness of breath, wheezing and stridor.   Cardiovascular: Negative for chest pain, palpitations and leg swelling.  Gastrointestinal: Negative for nausea, vomiting, abdominal pain, diarrhea, constipation, blood in stool, abdominal distention and anal bleeding.  Genitourinary: Negative for dysuria, hematuria, flank pain and difficulty urinating.  Musculoskeletal: Negative for myalgias, back pain, joint swelling, arthralgias and gait problem.  Skin: Negative for color change, pallor, rash and wound.  Neurological: Negative for dizziness, tremors, weakness and light-headedness.  Hematological: Negative for adenopathy. Does not bruise/bleed easily.  Psychiatric/Behavioral: Negative for behavioral problems, confusion, sleep disturbance, dysphoric mood, decreased concentration and agitation.        Objective:   Physical Exam  Constitutional: He is oriented to person, place, and time. He appears well-developed and well-nourished. No distress.  HENT:  Head: Normocephalic and atraumatic.  Mouth/Throat: Oropharynx is clear and moist. No oropharyngeal exudate.  Eyes: Conjunctivae and EOM are normal. Pupils are equal, round, and reactive to light. No scleral icterus.  Neck: Normal range of motion. Neck supple. No JVD present.  Cardiovascular: Normal rate, regular rhythm and normal heart sounds.  Exam reveals no gallop and no friction rub.   No murmur heard. Pulmonary/Chest: Effort normal and breath sounds normal. No respiratory distress. He has no wheezes. He has no rales. He exhibits no tenderness.  Abdominal: He exhibits no distension and no mass. There is no tenderness. There is no rebound and no guarding.  Musculoskeletal: He exhibits no edema and no tenderness.  Lymphadenopathy:    He has no cervical adenopathy.  Neurological: He is alert and oriented to person, place, and time. He has normal reflexes. He exhibits normal muscle tone. Coordination normal.  Skin: Skin is warm and dry. He is not diaphoretic. No erythema. No pallor.  Psychiatric: He has a normal mood and affect. His behavior is normal. Judgment and thought content normal.          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

## 2012-11-16 ENCOUNTER — Other Ambulatory Visit: Payer: Self-pay | Admitting: Licensed Clinical Social Worker

## 2012-11-16 DIAGNOSIS — B20 Human immunodeficiency virus [HIV] disease: Secondary | ICD-10-CM

## 2012-11-16 MED ORDER — RALTEGRAVIR POTASSIUM 400 MG PO TABS: 400.0000 mg | ORAL_TABLET | Freq: Two times a day (BID) | ORAL | Status: DC

## 2012-11-17 IMAGING — CR DG CHEST 2V
2 series · 2 of 2 positions shown · non-contrast
Comparison: 02/05/2011 and 10/01/2010.

CLINICAL DATA: Nonsmoker with cough and shortness of breath.

CHEST - 2 VIEW

[view not recorded (1 of 2)]
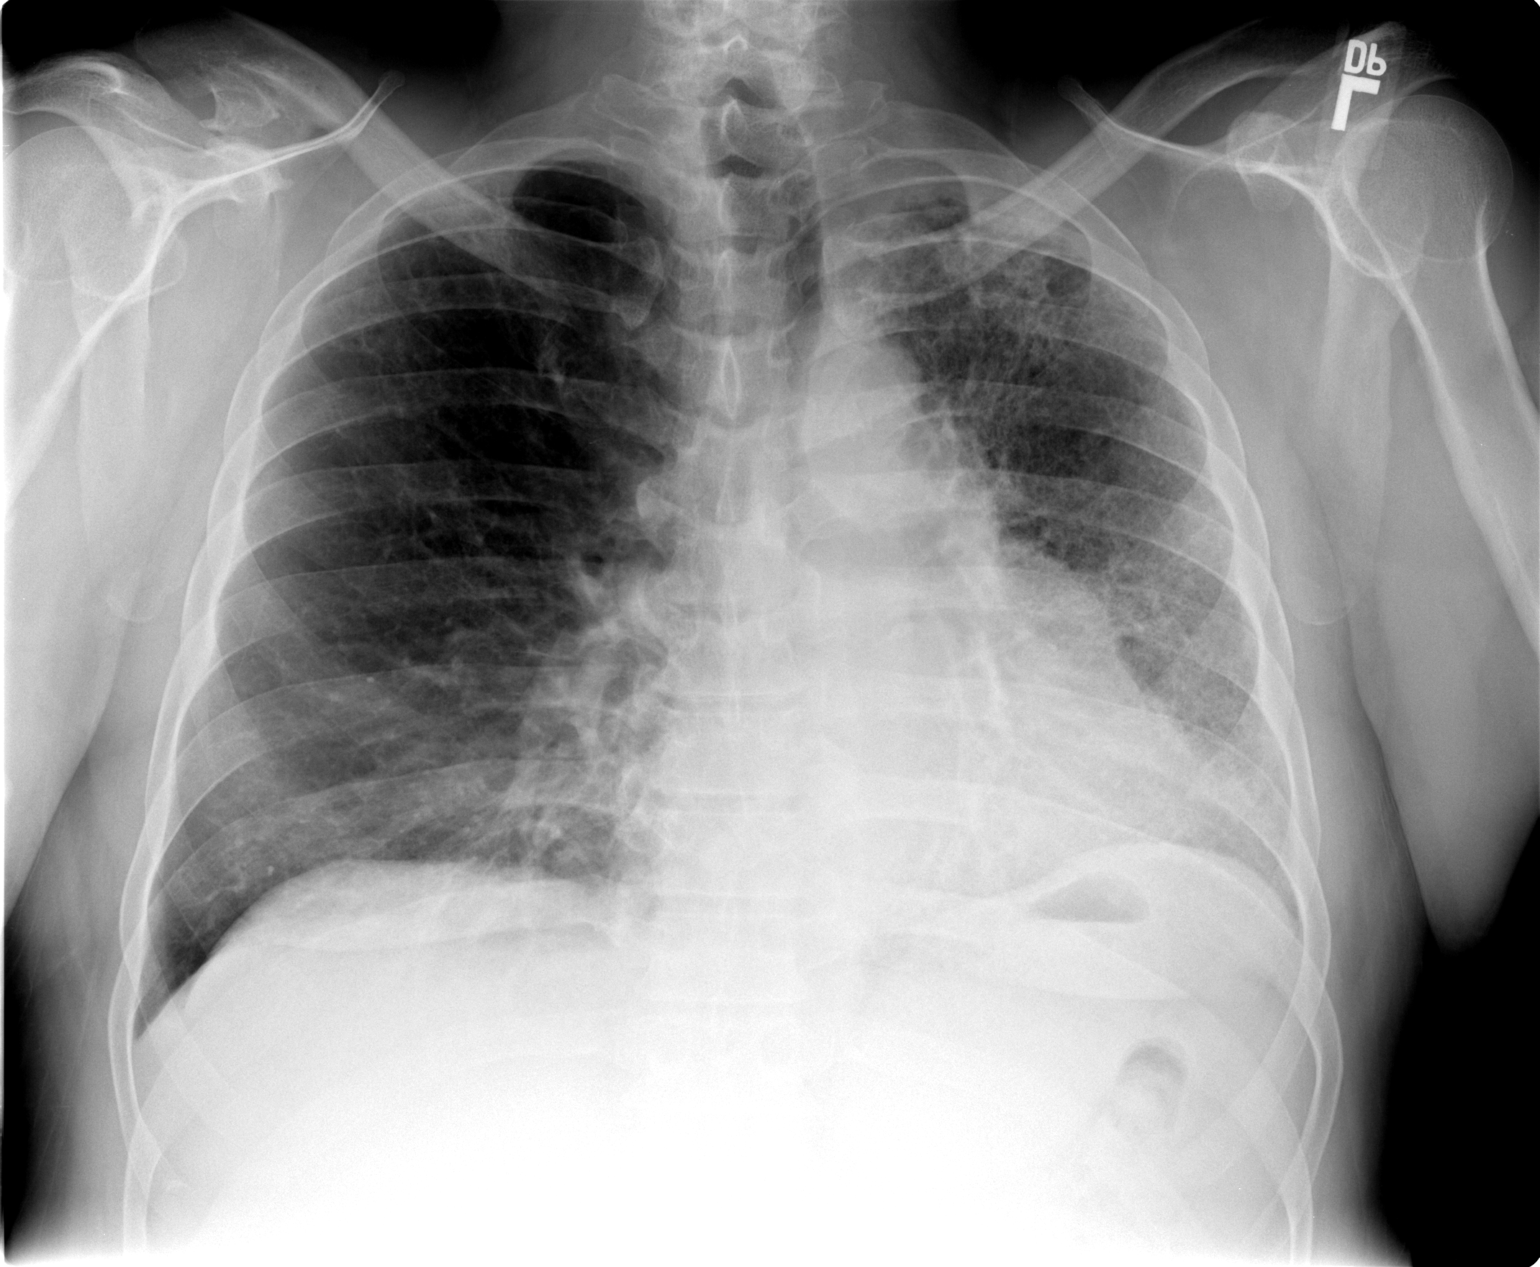

[view not recorded (2 of 2)]
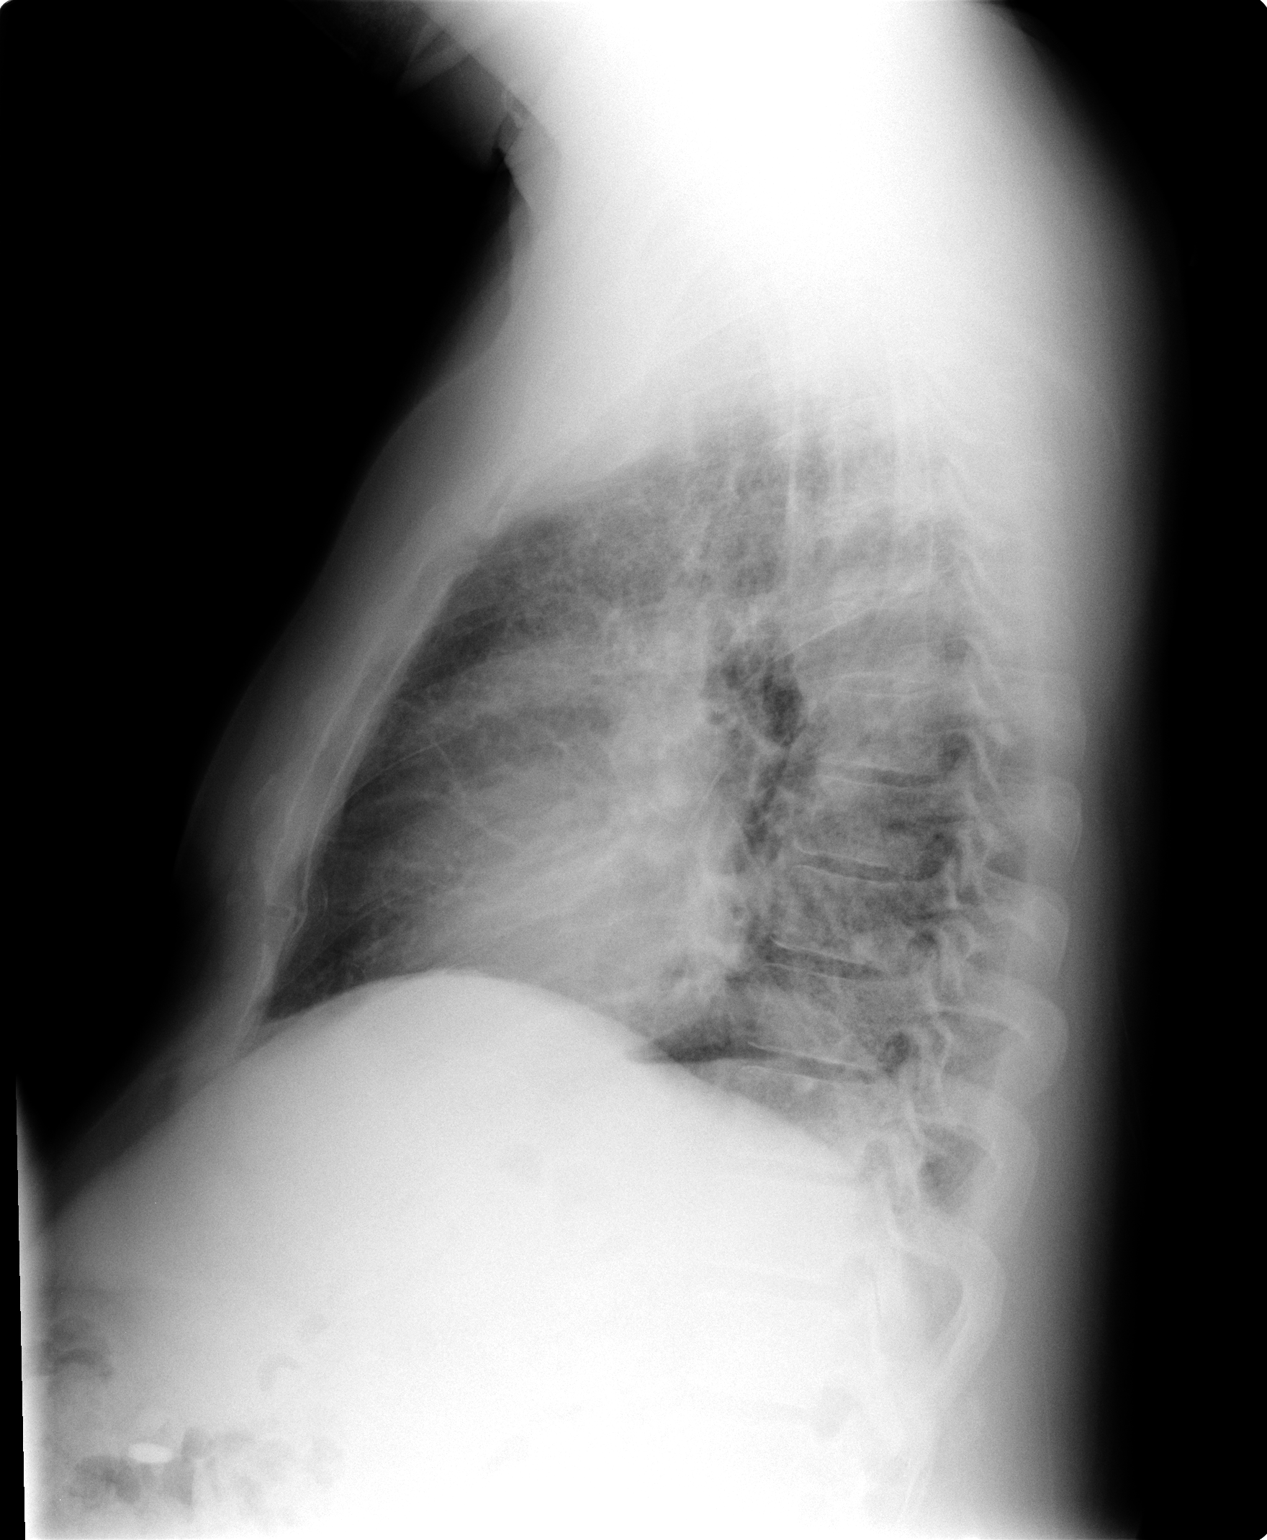

[2 of 2 positions shown; findings below may reference images not displayed]

FINDINGS: There is stable volume loss in the left hemithorax with
asymmetric fibrotic changes throughout the left lung.  Mild right
apical scarring appears stable.  No superimposed airspace disease
or pleural effusion is identified.  The heart size and mediastinal
contours are stable.  There are stable calcifications surrounding
the right coracoclavicular ligament.  No acute osseous findings are
evident.
IMPRESSION: Stable chronic asymmetric left lung disease as described.  No acute
cardiopulmonary process.

## 2012-12-21 ENCOUNTER — Other Ambulatory Visit: Payer: Self-pay | Admitting: *Deleted

## 2012-12-21 ENCOUNTER — Telehealth: Payer: Self-pay | Admitting: *Deleted

## 2012-12-21 DIAGNOSIS — N529 Male erectile dysfunction, unspecified: Secondary | ICD-10-CM

## 2012-12-21 MED ORDER — SILDENAFIL CITRATE 25 MG PO TABS: 25.0000 mg | ORAL_TABLET | ORAL | Status: DC

## 2012-12-21 NOTE — Telephone Encounter (Signed)
Patient's pharmacy called, pt came in wanting a refill on his viagra.  This hasn't been an active prescription since 2012.  Please advise if this is appropriate. Andree Coss, RN

## 2012-12-21 NOTE — Progress Notes (Signed)
OK to reissue rx per Dr. Lennox Laity, Zachary George, RN

## 2012-12-30 ENCOUNTER — Other Ambulatory Visit: Payer: Self-pay | Admitting: Licensed Clinical Social Worker

## 2012-12-30 DIAGNOSIS — G4700 Insomnia, unspecified: Secondary | ICD-10-CM

## 2012-12-30 MED ORDER — ZOLPIDEM TARTRATE 10 MG PO TABS: 10.0000 mg | ORAL_TABLET | Freq: Every evening | ORAL | Status: DC | PRN

## 2013-01-03 ENCOUNTER — Other Ambulatory Visit: Payer: Self-pay | Admitting: *Deleted

## 2013-01-03 DIAGNOSIS — B20 Human immunodeficiency virus [HIV] disease: Secondary | ICD-10-CM

## 2013-01-03 MED ORDER — LAMIVUDINE-ZIDOVUDINE 150-300 MG PO TABS: 1.0000 | ORAL_TABLET | Freq: Two times a day (BID) | ORAL | Status: DC

## 2013-01-07 ENCOUNTER — Other Ambulatory Visit: Payer: Self-pay | Admitting: Licensed Clinical Social Worker

## 2013-01-07 DIAGNOSIS — B20 Human immunodeficiency virus [HIV] disease: Secondary | ICD-10-CM

## 2013-01-07 MED ORDER — LAMIVUDINE-ZIDOVUDINE 150-300 MG PO TABS: 1.0000 | ORAL_TABLET | Freq: Two times a day (BID) | ORAL | Status: DC

## 2013-01-07 MED ORDER — ETRAVIRINE 200 MG PO TABS: 200.0000 mg | ORAL_TABLET | Freq: Two times a day (BID) | ORAL | Status: DC

## 2013-01-07 MED ORDER — RALTEGRAVIR POTASSIUM 400 MG PO TABS: 400.0000 mg | ORAL_TABLET | Freq: Two times a day (BID) | ORAL | Status: DC

## 2013-01-20 ENCOUNTER — Other Ambulatory Visit: Payer: Self-pay

## 2013-02-21 ENCOUNTER — Ambulatory Visit (INDEPENDENT_AMBULATORY_CARE_PROVIDER_SITE_OTHER): Payer: Medicare PPO | Admitting: *Deleted

## 2013-02-21 ENCOUNTER — Other Ambulatory Visit: Payer: Commercial Managed Care - HMO

## 2013-02-21 DIAGNOSIS — Z23 Encounter for immunization: Secondary | ICD-10-CM

## 2013-02-21 DIAGNOSIS — B20 Human immunodeficiency virus [HIV] disease: Secondary | ICD-10-CM

## 2013-02-21 LAB — CBC WITH DIFFERENTIAL/PLATELET
Basophils Absolute: 0 10*3/uL (ref 0.0–0.1)
Eosinophils Relative: 7 % — ABNORMAL HIGH (ref 0–5)
HCT: 41.3 % (ref 39.0–52.0)
Lymphocytes Relative: 34 % (ref 12–46)
Lymphs Abs: 1.3 10*3/uL (ref 0.7–4.0)
MCH: 36.8 pg — ABNORMAL HIGH (ref 26.0–34.0)
MCHC: 35.8 g/dL (ref 30.0–36.0)
MCV: 102.7 fL — ABNORMAL HIGH (ref 78.0–100.0)
Neutro Abs: 1.9 10*3/uL (ref 1.7–7.7)
Platelets: 230 10*3/uL (ref 150–400)
RBC: 4.02 MIL/uL — ABNORMAL LOW (ref 4.22–5.81)
RDW: 12.8 % (ref 11.5–15.5)
WBC: 3.9 10*3/uL — ABNORMAL LOW (ref 4.0–10.5)

## 2013-02-21 LAB — COMPLETE METABOLIC PANEL WITH GFR
ALT: 19 U/L (ref 0–53)
AST: 19 U/L (ref 0–37)
Albumin: 4 g/dL (ref 3.5–5.2)
CO2: 27 mEq/L (ref 19–32)
Calcium: 9.4 mg/dL (ref 8.4–10.5)
Chloride: 103 mEq/L (ref 96–112)
Creat: 0.98 mg/dL (ref 0.50–1.35)
GFR, Est African American: >89 mL/min
Glucose, Bld: 115 mg/dL — ABNORMAL HIGH (ref 70–99)
Potassium: 3.9 mEq/L (ref 3.5–5.3)
Total Protein: 7.1 g/dL (ref 6.0–8.3)

## 2013-02-22 LAB — HIV-1 RNA QUANT-NO REFLEX-BLD
HIV 1 RNA Quant: <20 copies/mL (ref <20)
HIV-1 RNA Quant, Log: <1.3 {Log} (ref <1.30)

## 2013-03-07 ENCOUNTER — Ambulatory Visit: Payer: Medicare PPO | Admitting: Infectious Disease

## 2013-03-28 ENCOUNTER — Ambulatory Visit (INDEPENDENT_AMBULATORY_CARE_PROVIDER_SITE_OTHER): Payer: Medicare HMO | Admitting: Infectious Disease

## 2013-03-28 ENCOUNTER — Encounter: Payer: Self-pay | Admitting: Infectious Disease

## 2013-03-28 ENCOUNTER — Other Ambulatory Visit: Payer: Self-pay | Admitting: *Deleted

## 2013-03-28 VITALS — BP 137/84 | HR 54 | Temp 97.2°F | Wt 189.0 lb

## 2013-03-28 DIAGNOSIS — B20 Human immunodeficiency virus [HIV] disease: Secondary | ICD-10-CM

## 2013-03-28 DIAGNOSIS — R6510 Systemic inflammatory response syndrome (SIRS) of non-infectious origin without acute organ dysfunction: Secondary | ICD-10-CM

## 2013-03-28 DIAGNOSIS — B0052 Herpesviral keratitis: Secondary | ICD-10-CM

## 2013-03-28 DIAGNOSIS — H00013 Hordeolum externum right eye, unspecified eyelid: Secondary | ICD-10-CM

## 2013-03-28 DIAGNOSIS — D893 Immune reconstitution syndrome: Secondary | ICD-10-CM

## 2013-03-28 DIAGNOSIS — H00019 Hordeolum externum unspecified eye, unspecified eyelid: Secondary | ICD-10-CM

## 2013-03-28 DIAGNOSIS — I5032 Chronic diastolic (congestive) heart failure: Secondary | ICD-10-CM

## 2013-03-28 DIAGNOSIS — F528 Other sexual dysfunction not due to a substance or known physiological condition: Secondary | ICD-10-CM

## 2013-03-28 MED ORDER — SILDENAFIL CITRATE 100 MG PO TABS: 100.0000 mg | ORAL_TABLET | Freq: Every day | ORAL | Status: DC | PRN

## 2013-03-28 MED ORDER — FUROSEMIDE 40 MG PO TABS: 40.0000 mg | ORAL_TABLET | Freq: Every day | ORAL | Status: DC

## 2013-03-28 NOTE — Progress Notes (Signed)
  Subjective:    Patient ID: Paul [name], male    DOB: 1956-01-23, 58 y.o.   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  His VL is <20 and CD4>200. Paul [name] has a stye on right eyelid and was rx antibiotic for this plus warm compresses and this is getting better  Review of Systems  Constitutional: Negative for fever, chills, diaphoresis, activity change, appetite change, fatigue and unexpected weight change.  HENT: Negative for congestion, rhinorrhea, sinus pressure, sneezing, sore throat and trouble swallowing.   Eyes: Negative for photophobia and visual disturbance.  Respiratory: Negative for cough, chest tightness, shortness of breath, wheezing and stridor.   Cardiovascular: Negative for chest pain, palpitations and leg swelling.  Gastrointestinal: Negative for nausea, vomiting, abdominal pain, diarrhea, constipation, blood in stool, abdominal distention and anal bleeding.  Genitourinary: Negative for dysuria, hematuria, flank pain and difficulty urinating.  Musculoskeletal: Negative for arthralgias, back pain, gait problem, joint swelling and myalgias.  Skin: Negative for color change, pallor, rash and wound.  Neurological: Negative for dizziness, tremors, weakness and light-headedness.  Hematological: Negative for adenopathy. Does not bruise/bleed easily.  Psychiatric/Behavioral: Negative for behavioral problems, confusion, sleep disturbance, dysphoric mood, decreased concentration and agitation.       Objective:   Physical Exam  Constitutional: Paul [name] is oriented to person, place, and time. Paul [name] appears well-developed and well-nourished. No distress.  HENT:  Head: Normocephalic and atraumatic.  Mouth/Throat: Oropharynx is clear and moist. No oropharyngeal exudate.  Eyes: Conjunctivae and EOM are  normal. Pupils are equal, round, and reactive to light. No scleral icterus.    Neck: Normal range of motion. Neck supple. No JVD present.  Cardiovascular: Normal rate, regular rhythm and normal heart sounds.  Exam reveals no gallop and no friction rub.   No murmur heard. Pulmonary/Chest: Effort normal and breath sounds normal. No respiratory distress. Paul [name] has no wheezes. Paul [name] has no rales. Paul [name] exhibits no tenderness.  Abdominal: Paul [name] exhibits no distension and no mass. There is no tenderness. There is no rebound and no guarding.  Musculoskeletal: Paul [name] exhibits no edema and no tenderness.  Lymphadenopathy:    Paul [name] has no cervical adenopathy.  Neurological: Paul [name] is alert and oriented to person, place, and time. Paul [name] has normal reflexes. Paul [name] exhibits normal muscle tone. Coordination normal.  Skin: Skin is warm and dry. Paul [name] is not diaphoretic. No erythema. No pallor.  Psychiatric: Paul [name] has a normal mood and affect. His behavior is normal. Judgment and thought content normal.            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

## 2013-04-24 ENCOUNTER — Other Ambulatory Visit: Payer: Self-pay | Admitting: Infectious Disease

## 2013-04-26 ENCOUNTER — Other Ambulatory Visit: Payer: Self-pay | Admitting: *Deleted

## 2013-04-26 DIAGNOSIS — I5030 Unspecified diastolic (congestive) heart failure: Secondary | ICD-10-CM

## 2013-04-26 MED ORDER — POTASSIUM CHLORIDE CRYS ER 10 MEQ PO TBCR: 10.0000 meq | EXTENDED_RELEASE_TABLET | Freq: Every day | ORAL | Status: DC

## 2013-05-13 ENCOUNTER — Other Ambulatory Visit: Payer: Self-pay

## 2013-05-13 DIAGNOSIS — G4700 Insomnia, unspecified: Secondary | ICD-10-CM

## 2013-05-13 MED ORDER — ZOLPIDEM TARTRATE 10 MG PO TABS: 10.0000 mg | ORAL_TABLET | Freq: Every evening | ORAL | Status: DC | PRN

## 2013-06-02 ENCOUNTER — Other Ambulatory Visit: Payer: Self-pay | Admitting: Ophthalmology

## 2013-06-02 ENCOUNTER — Ambulatory Visit
Admission: RE | Admit: 2013-06-02 | Discharge: 2013-06-02 | Disposition: A | Discharge status: Home / Self Care | Payer: Commercial Managed Care - HMO | Source: Ambulatory Visit | Attending: Ophthalmology | Admitting: Ophthalmology

## 2013-06-02 DIAGNOSIS — Z01818 Encounter for other preprocedural examination: Secondary | ICD-10-CM

## 2013-06-06 ENCOUNTER — Encounter: Payer: Self-pay | Admitting: Infectious Disease

## 2013-06-06 ENCOUNTER — Ambulatory Visit (INDEPENDENT_AMBULATORY_CARE_PROVIDER_SITE_OTHER): Payer: Commercial Managed Care - HMO | Admitting: Infectious Disease

## 2013-06-06 VITALS — BP 143/98 | HR 76 | Temp 97.7°F | Wt 184.0 lb

## 2013-06-06 DIAGNOSIS — R6510 Systemic inflammatory response syndrome (SIRS) of non-infectious origin without acute organ dysfunction: Secondary | ICD-10-CM

## 2013-06-06 DIAGNOSIS — R0989 Other specified symptoms and signs involving the circulatory and respiratory systems: Secondary | ICD-10-CM

## 2013-06-06 DIAGNOSIS — R0609 Other forms of dyspnea: Secondary | ICD-10-CM | POA: Diagnosis not present

## 2013-06-06 DIAGNOSIS — B20 Human immunodeficiency virus [HIV] disease: Secondary | ICD-10-CM

## 2013-06-06 DIAGNOSIS — F528 Other sexual dysfunction not due to a substance or known physiological condition: Secondary | ICD-10-CM | POA: Diagnosis not present

## 2013-06-06 DIAGNOSIS — H049 Disorder of lacrimal system, unspecified: Secondary | ICD-10-CM

## 2013-06-06 DIAGNOSIS — D893 Immune reconstitution syndrome: Secondary | ICD-10-CM

## 2013-06-06 DIAGNOSIS — I5032 Chronic diastolic (congestive) heart failure: Secondary | ICD-10-CM

## 2013-06-06 DIAGNOSIS — Z113 Encounter for screening for infections with a predominantly sexual mode of transmission: Secondary | ICD-10-CM | POA: Diagnosis not present

## 2013-06-06 LAB — COMPLETE METABOLIC PANEL WITH GFR
ALT: 26 U/L (ref 0–53)
AST: 25 U/L (ref 0–37)
Albumin: 4.2 g/dL (ref 3.5–5.2)
Alkaline Phosphatase: 106 U/L (ref 39–117)
BUN: 9 mg/dL (ref 6–23)
CALCIUM: 9.5 mg/dL (ref 8.4–10.5)
CHLORIDE: 101 meq/L (ref 96–112)
CO2: 27 mEq/L (ref 19–32)
Creat: 0.95 mg/dL (ref 0.50–1.35)
GFR, Est African American: >89 mL/min
GFR, Est Non African American: 88 mL/min
Glucose, Bld: 90 mg/dL (ref 70–99)
POTASSIUM: 4.8 meq/L (ref 3.5–5.3)
Sodium: 138 mEq/L (ref 135–145)
Total Bilirubin: 0.5 mg/dL (ref 0.2–1.2)
Total Protein: 7.9 g/dL (ref 6.0–8.3)

## 2013-06-06 LAB — RPR

## 2013-06-06 MED ORDER — TADALAFIL 10 MG PO TABS: 10.0000 mg | ORAL_TABLET | Freq: Every day | ORAL | Status: DC | PRN

## 2013-06-06 NOTE — Progress Notes (Signed)
Subjective:    Patient ID: Paul [name], male    DOB: [birth year], 58 y.o.   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  His VL is <20 and CD4>200. He is to have surgery shortly on tear duct.  Patient is having some dyspnea on exertion that is being noticed by his girlfriend recently and she is concerned about this. It has not worsened in intensity but has persisted for the last several months. We did angulatory saturations hearing dropped to 92% with exertion here in the clinic and did feel a bit short winded. He has been diagnosed with diastolic dysfunction is also been seen by pulmonary medicine his primary care physician is Dr. Nehemiah Settle here   Review of Systems  Constitutional: Negative for fever, chills, diaphoresis, activity change, appetite change, fatigue and unexpected weight change.  HENT: Negative for congestion, rhinorrhea, sinus pressure, sneezing, sore throat and trouble swallowing.   Eyes: Negative for photophobia and visual disturbance.  Respiratory: Negative for cough, chest tightness, shortness of breath, wheezing and stridor.   Cardiovascular: Negative for chest pain, palpitations and leg swelling.  Gastrointestinal: Negative for nausea, vomiting, abdominal pain, diarrhea, constipation, blood in stool, abdominal distention and anal bleeding.  Genitourinary: Negative for dysuria, hematuria, flank pain and difficulty urinating.  Musculoskeletal: Negative for arthralgias, back pain, gait problem, joint swelling and myalgias.  Skin: Negative for color change, pallor, rash and wound.  Neurological: Negative for dizziness, tremors, weakness and light-headedness.  Hematological: Negative for adenopathy. Does not bruise/bleed easily.  Psychiatric/Behavioral: Negative for behavioral problems,  confusion, sleep disturbance, dysphoric mood, decreased concentration and agitation.       Objective:   Physical Exam  Constitutional: He is oriented to person, place, and time. He appears well-developed and well-nourished. No distress.  HENT:  Head: Normocephalic and atraumatic.  Mouth/Throat: Oropharynx is clear and moist. No oropharyngeal exudate.  Eyes: Conjunctivae and EOM are normal. Pupils are equal, round, and reactive to light. No scleral icterus.  Neck: Normal range of motion. Neck supple. No JVD present.  Cardiovascular: Normal rate, regular rhythm and normal heart sounds.  Exam reveals no gallop and no friction rub.   No murmur heard. Pulmonary/Chest: Effort normal and breath sounds normal. No respiratory distress. He has no wheezes. He has no rales. He exhibits no tenderness.  Abdominal: He exhibits no distension and no mass. There is no tenderness. There is no rebound and no guarding.  Musculoskeletal: He exhibits no edema and no tenderness.  Lymphadenopathy:    He has no cervical adenopathy.  Neurological: He is alert and oriented to person, place, and time. He has normal reflexes. He exhibits normal muscle tone. Coordination normal.  Skin: Skin is warm and dry. He is not diaphoretic. No erythema. No pallor.  Psychiatric: He has a normal mood and affect. His behavior is normal. Judgment and thought content normal.            Assessment & Plan:   #1: HIV: Perfect virological suppression, continue current regimen, and discontinue Bactrim for now recheck labs in October.   #2  tear duct problem: To have surgery soon.   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  #6 dyspnea on exertion could be potential he partially do to his diastolic heart  failure plus or minus lung disease cone. Would have him followup with primary care physician and with pulmonary. I spent greater than 40 minutes with the patient including greater than 50% of time in face to face counsel of the patient and in coordination of their care.

## 2013-06-07 ENCOUNTER — Ambulatory Visit (INDEPENDENT_AMBULATORY_CARE_PROVIDER_SITE_OTHER): Payer: Medicare HMO | Admitting: Internal Medicine

## 2013-06-07 ENCOUNTER — Encounter: Payer: Self-pay | Admitting: Internal Medicine

## 2013-06-07 VITALS — BP 122/84 | HR 67 | Temp 98.0°F | Ht 69.0 in | Wt 185.0 lb

## 2013-06-07 DIAGNOSIS — R0602 Shortness of breath: Secondary | ICD-10-CM

## 2013-06-07 LAB — T-HELPER CELL (CD4) - (RCID CLINIC ONLY)
CD4 % Helper T Cell: 14 % — ABNORMAL LOW (ref 33–55)
CD4 T Cell Abs: 200 /uL — ABNORMAL LOW (ref 400–2700)

## 2013-06-07 LAB — HIV-1 RNA QUANT-NO REFLEX-BLD
HIV 1 RNA Quant: <20 copies/mL (ref <20)
HIV-1 RNA Quant, Log: <1.3 {Log} (ref <1.30)

## 2013-06-07 LAB — TESTOSTERONE: Testosterone: 420 ng/dL (ref 300–890)

## 2013-06-07 NOTE — Progress Notes (Signed)
Subjective:     Patient ID: Paul [name], male   DOB: 1955-05-03    MRN: [id number]   Brief patient profile:  57 yobm quit smoking around 2002 with no resp problems then around 2007 sob much worse for sev weeks/months eventually  better not sure what rx but not eval by pulmonary or on pulmonary meds then that he recalls,  then recurrent indolent onset progressive doe x sev months  while not any meds for HIV and referred to pulmonary clinic by [facility] for eval 07/2010  With pft's showing restrictive changes 09/2010   HPI 08/15/2010 Initial pulmonary office eval   cc sob x sev months with exertion (walking flat x 50 ft) assoc with intermittent dry cough, still not back on HIV meds due to insurance issues, with last CD4 count 60 07/15/10 but no fever on prophylactic dose of bactrim. Sleeping ok without nocturnal  or early am exac of resp c/o's or need for noct saba.  rec You need to start your medications as soon as possible because you may have a low grade form of pcp pneumonia  10/01/2010 ov/Wert cc breathing and cough better on hiv rx. Here for f/u pfts. Not really limited by sob though no longer aerobically active. No purulent or excess sputum rec Return to this clinic if you feel you are losing any ground over the next 3 months in terms of your activity tolerance, but you should gradually improve to your satisfaction by the end of 3 months, if not please return here   06/23/2011 f/u ov/Wert cc much worse sob with "cold" x 3 months but now improving not using any inhalers, spont improved s rx and no longer coughing, no report of purulent sputum. rec No change rx, pft's on return  08/04/2011 f/u ov/Wert cc doe x 10 min treadmill 2 pm varying grade, minimal noct cough x a few min, not using any inhalers at all at this point rec To get the most out of exercise, you need to be continuously aware that you are short of breath, but never out of breath, for 30 minutes daily.   If loosing ground with  exercise tolerance we need to see you back here    06/07/2013 f/u ov/Wert re: HIV/ ILD  On no resp rx  Chief Complaint  Patient presents with  . Follow-up    Pt last seen 2013. He reports his breathing is no better or worse since then. No new co's today.     Able treadmill x 45 min and only a little winded at variable incline Goal is to get to top of 3 story commercial building  Yard work ok with stopping HIV in great shape per pt/ ID notes - did not desat in office   No obvious day to day or daytime variabilty or assoc chronic cough or cp or chest tightness, subjective wheeze overt sinus or hb symptoms. No unusual exp hx or h/o childhood pna/ asthma or knowledge of premature birth.  Sleeping ok without nocturnal  or early am exacerbation  of respiratory  c/o's or need for noct saba. Also denies any obvious fluctuation of symptoms with weather or environmental changes or other aggravating or alleviating factors except as outlined above   Current Medications, Allergies, Complete Past Medical History, Past Surgical History, Family History, and Social History were reviewed in [employer] record.  ROS  The following are not active complaints unless bolded sore throat, dysphagia, dental problems, itching, sneezing,  nasal congestion or excess/ purulent secretions, ear ache,   fever, chills, sweats, unintended wt loss, pleuritic or exertional cp, hemoptysis,  orthopnea pnd or leg swelling, presyncope, palpitations, heartburn, abdominal pain, anorexia, nausea, vomiting, diarrhea  or change in bowel or urinary habits, change in stools or urine, dysuria,hematuria,  rash, arthralgias, visual complaints, headache, numbness weakness or ataxia or problems with walking or coordination,  change in mood/affect or memory.                     Objective:   Physical Exam    pleasant bm amb nad   Wt 188 08/15/2010  > 189 10/01/2010 > 06/23/2011 215 > 08/04/2011  189 > 06/07/2013   185  HEENT mild turbinate edema.  Oropharynx no thrush or excess pnd or cobblestoning.  No JVD or cervical adenopathy. Mild accessory muscle hypertrophy. Trachea midline, nl thryroid. Chest was hyperinflated by percussion with diminished breath sounds and moderate increased exp time without wheeze. Hoover sign positive at mid inspiration. Regular rate and rhythm without murmur gallop or rub or increase P2 or edema.  Abd: no hsm, nl excursion. Ext warm without cyanosis or clubbing.      06/02/13 Some progression of apparent chronic interstitial lung disease as  noted on prior CT.         Assessment:

## 2013-06-07 NOTE — Patient Instructions (Signed)
To get the most out of exercise, you need to be continuously aware that you are short of breath, but never out of breath, for 30 minutes daily. As you improve, it will actually be easier for you to do the same amount of exercise  in  30 minutes so always push to the level where you are short of breath.    Please schedule a follow up office visit in 6 weeks, call sooner if needed with pfts on return

## 2013-06-08 NOTE — Assessment & Plan Note (Addendum)
-    Walked one lap @ 185 stopped due to  desat to 87 RA  08/15/10    - PFT's 10/01/2010  FEV1  1.98 (59%) ratio 82 and DLC0  36% corrects to 82    - PFT's 08/04/2011  FEV1 2.15 (65%) ratio 80 and no better with B2,  DLCO 47%    - 08/04/2011  Walked RA x 3 laps @ 185 ft each stopped due to  End of study no desat  cxr suggests ILD but hx does not - ddx includes diastolic chf > rec continue max activity tol and return for pfts and repeat bnp  Lab Results  Component Value Date   PROBNP <30.0 pg/mL 05/31/2009

## 2013-07-14 ENCOUNTER — Other Ambulatory Visit: Payer: Self-pay | Admitting: Licensed Clinical Social Worker

## 2013-07-14 ENCOUNTER — Ambulatory Visit: Payer: Medicare HMO | Admitting: Internal Medicine

## 2013-07-14 DIAGNOSIS — B20 Human immunodeficiency virus [HIV] disease: Secondary | ICD-10-CM

## 2013-07-14 MED ORDER — RALTEGRAVIR POTASSIUM 400 MG PO TABS: 400.0000 mg | ORAL_TABLET | Freq: Two times a day (BID) | ORAL | Status: DC

## 2013-07-26 ENCOUNTER — Encounter: Payer: Self-pay | Admitting: Internal Medicine

## 2013-07-26 ENCOUNTER — Ambulatory Visit (INDEPENDENT_AMBULATORY_CARE_PROVIDER_SITE_OTHER): Payer: Commercial Managed Care - HMO | Admitting: Internal Medicine

## 2013-07-26 VITALS — BP 124/62 | HR 67 | Temp 97.6°F | Ht 68.0 in | Wt 186.0 lb

## 2013-07-26 DIAGNOSIS — R0602 Shortness of breath: Secondary | ICD-10-CM

## 2013-07-26 MED ORDER — FAMOTIDINE 20 MG PO TABS: ORAL_TABLET | ORAL | Status: DC

## 2013-07-26 MED ORDER — PANTOPRAZOLE SODIUM 40 MG PO TBEC: 40.0000 mg | DELAYED_RELEASE_TABLET | Freq: Every day | ORAL | Status: DC

## 2013-07-26 NOTE — Progress Notes (Signed)
PFT done today. 

## 2013-07-26 NOTE — Progress Notes (Signed)
Subjective:     Patient ID: Paul [name], male   DOB: 06/15/1955    MRN: [id number]   Brief patient profile:  57 yobm quit smoking around 2002 with no resp problems then around 2007 sob much worse for sev weeks/months eventually  better not sure what rx but not eval by pulmonary or on pulmonary meds then that he recalls,  then recurrent indolent onset progressive doe x sev months  while not any meds for HIV and [facility]  With pft's showing restrictive changes 09/2010   HPI 08/15/2010 Initial pulmonary office eval   cc sob x sev months with exertion (walking flat x 50 ft) assoc with intermittent dry cough, still not back on HIV meds due to insurance issues, with last CD4 count 60 07/15/10 but no fever on prophylactic dose of bactrim. Sleeping ok without nocturnal  or early am exac of resp c/o's or need for noct saba.  rec You need to start your medications as soon as possible because you may have a low grade form of pcp pneumonia  10/01/2010 ov/Paul [name] cc breathing and cough better on hiv rx. Here for f/u pfts. Not really limited by sob though no longer aerobically active. No purulent or excess sputum rec Return to this clinic if you feel you are losing any ground over the next 3 months in terms of your activity tolerance, but you should gradually improve to your satisfaction by the end of 3 months, if not please return here   06/23/2011 f/u ov/Paul [name] cc much worse sob with "cold" x 3 months but now improving not using any inhalers, spont improved s rx and no longer coughing, no report of purulent sputum. rec No change rx, pft's on return  08/04/2011 f/u ov/Paul [name] cc doe x 10 min treadmill 2 pm varying grade, minimal noct cough x a few min, not using any inhalers at all at this point rec To get the most out of exercise, you need to be continuously aware that you are short of breath, but never out of breath, for 30 minutes daily.   If loosing ground with  exercise tolerance we need to see you back here    06/07/2013 f/u ov/Paul [name] re: HIV/ ILD  On no resp rx  Chief Complaint  Patient presents with  . Follow-up    Pt last seen 2013. He reports his breathing is no better or worse since then. No new co's today.     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 As you improve, it will actually be easier for you to do the same amount of exercise  in  30 minutes so always push to the level where you are short of breath.   Please schedule a follow up office visit in 6 weeks, call sooner if needed with pfts on return    07/26/2013 f/u ov/Paul [name] re: PF Chief Complaint  Patient presents with  . Follow-up    Pt states that his breathing is unchanged. No new co's today.     Not limited by breathing from desired activities  No obvious day to day or daytime variabilty or assoc chronic cough or cp or chest tightness, subjective wheeze overt sinus or hb symptoms. No unusual exp hx or h/o childhood pna/ asthma or knowledge of premature birth.  Sleeping ok without nocturnal  or early am exacerbation  of respiratory  c/o's or need for noct saba. Also denies any obvious fluctuation of symptoms with weather or environmental changes or other aggravating or alleviating factors except as outlined above   Current Medications, Allergies, Complete Past Medical History, Past Surgical History, Family History, and Social History were reviewed in [facility] electronic medical record.  ROS  The following are not active complaints unless bolded sore throat, dysphagia, dental problems, itching, sneezing,  nasal congestion or excess/ purulent secretions,  ear ache,   fever, chills, sweats, unintended wt loss, pleuritic or exertional cp, hemoptysis,  orthopnea pnd or leg swelling, presyncope, palpitations, heartburn, abdominal pain, anorexia, nausea, vomiting, diarrhea  or change in bowel or urinary habits, change in stools or urine, dysuria,hematuria,  rash, arthralgias, visual complaints, headache, numbness weakness or ataxia or problems with walking or coordination,  change in mood/affect or memory.                     Objective:   Physical Exam    pleasant bm amb nad    Wt 188 08/15/2010  > 189 10/01/2010 > 06/23/2011 215 > 08/04/2011  189 > 06/07/2013  185 >  07/26/2013 241  HEENT mild turbinate edema.  Oropharynx no thrush or excess pnd or cobblestoning.  No JVD or cervical adenopathy. Mild accessory muscle hypertrophy. Trachea midline, nl thryroid. Chest was hyperinflated by percussion with diminished breath sounds and moderate increased exp time without wheeze. Hoover sign positive at mid inspiration. Regular rate and rhythm without murmur gallop or rub or increase P2 or edema.  Abd: no hsm, nl excursion. Ext warm without cyanosis or clubbing.      06/02/13 Some progression of apparent chronic interstitial lung disease as  noted on prior CT.         Assessment:

## 2013-07-26 NOTE — Patient Instructions (Addendum)
Pantoprazole (protonix) 40 mg   Take 30-60 min before first meal of the day and Pepcid 20 mg one bedtime until return to office - this is the best way to tell whether stomach acid is contributing to your problem.    GERD (REFLUX)  is an extremely common cause of respiratory symptoms, many times with no significant heartburn at all.    It can be treated with medication, but also with lifestyle changes including avoidance of late meals, excessive alcohol, smoking cessation, and avoid fatty foods, chocolate, peppermint, colas, red wine, and acidic juices such as orange juice.  NO MINT OR MENTHOL PRODUCTS SO NO COUGH DROPS  USE SUGARLESS CANDY INSTEAD (jolley ranchers or Stover's)  NO OIL BASED VITAMINS - use powdered substitutes.  Please schedule a follow up office visit in 6 weeks, call sooner if needed

## 2013-07-28 NOTE — Assessment & Plan Note (Signed)
-    Walked one lap @ 185 stopped due to  desat to 87 RA  08/15/10    - PFT's 10/01/2010  FEV1  1.98 (59%) ratio 82 and DLC0  36% corrects to 82    - PFT's 08/04/2011  FEV1 2.15 (65%) ratio 80 and no better with B2,  DLCO 47%    - PFTs  07/26/13      FEV1 1.84 (62%) ratio 88 and no change with B2 DLCO 47%    - 08/04/2011  Walked RA x 3 laps @ 185 ft each stopped due to  End of study no desat    - 07/26/2013  Walked RA x 3 laps @ 185 ft each stopped due to end of study no desats   There really has been no sign change in studies but concerned about occult ILD/ PF  Use of PPI is associated with improved survival time and with decreased radiologic fibrosis per King's study published in AJRCCM vol 184 p1390.  Dec 2011  This may not be cause and effect, but given how universally unhelpful all the otherstudy drugs have been for pf,   rec start  rx ppi / diet/ lifestyle modification.    Will regroup in 6 week with serial walking sats

## 2013-08-01 LAB — PULMONARY FUNCTION TEST
DL/VA % pred: 91 %
DL/VA: 4.1 ml/min/mmHg/L
DLCO UNC % PRED: 47 %
DLCO UNC: 13.98 ml/min/mmHg
FEF 25-75 POST: 3.4 L/s
FEF 25-75 PRE: 2.72 L/s
FEF2575-%Change-Post: 24 %
FEF2575-%PRED-POST: 121 %
FEF2575-%Pred-Pre: 97 %
FEV1-%Change-Post: 5 %
FEV1-%Pred-Post: 66 %
FEV1-%Pred-Pre: 62 %
FEV1-PRE: 1.84 L
FEV1-Post: 1.95 L
FEV1FVC-%Change-Post: 0 %
FEV1FVC-%PRED-PRE: 110 %
FEV6-%CHANGE-POST: 4 %
FEV6-%PRED-POST: 61 %
FEV6-%PRED-PRE: 58 %
FEV6-POST: 2.21 L
FEV6-Pre: 2.1 L
FEV6FVC-%Change-Post: 0 %
FEV6FVC-%Pred-Post: 104 %
FEV6FVC-%Pred-Pre: 104 %
FVC-%CHANGE-POST: 6 %
FVC-%PRED-POST: 59 %
FVC-%PRED-PRE: 56 %
FVC-POST: 2.24 L
FVC-PRE: 2.1 L
PRE FEV1/FVC RATIO: 88 %
Post FEV1/FVC ratio: 87 %
Post FEV6/FVC ratio: 100 %
Pre FEV6/FVC Ratio: 100 %
RV % pred: 63 %
RV: 1.33 L
TLC % pred: 54 %
TLC: 3.57 L

## 2013-08-19 ENCOUNTER — Other Ambulatory Visit: Payer: Self-pay | Admitting: Infectious Disease

## 2013-08-19 DIAGNOSIS — B20 Human immunodeficiency virus [HIV] disease: Secondary | ICD-10-CM

## 2013-09-12 ENCOUNTER — Encounter: Payer: Self-pay | Admitting: Internal Medicine

## 2013-09-12 ENCOUNTER — Ambulatory Visit (INDEPENDENT_AMBULATORY_CARE_PROVIDER_SITE_OTHER): Payer: Commercial Managed Care - HMO | Admitting: Internal Medicine

## 2013-09-12 VITALS — BP 112/80 | HR 68 | Temp 97.9°F | Ht 69.0 in | Wt 193.0 lb

## 2013-09-12 DIAGNOSIS — R0602 Shortness of breath: Secondary | ICD-10-CM

## 2013-09-12 NOTE — Patient Instructions (Addendum)
Please schedule a follow up office visit in 4 weeks, sooner if needed with PFTs on return

## 2013-09-12 NOTE — Assessment & Plan Note (Signed)
-    Walked one lap @ 185 stopped due to  desat to 87 RA  08/15/10    - PFT's 10/01/2010  FEV1  1.98 (59%) ratio 82 and DLC0  36% corrects to 82    - PFT's 08/04/2011  FEV1 2.15 (65%) ratio 80 and no better with B2,  DLCO 47%    - PFTs  07/26/13      FEV1 1.84 (62%) ratio 88 and no change with B2 DLCO 47%    - 08/04/2011  Walked RA x 3 laps @ 185 ft each stopped due to  End of study no desat    - 07/26/2013  Walked RA x 3 laps @ 185 ft each stopped due to end of study sats 91%    - 09/12/2013  Walked RA x 3 laps @ 185 ft each stopped due to  Sat 90% at end of study but no sob  DDx for pulmonary fibrosis  includes idiopathic pulmonary fibrosis, pulmonary fibrosis associated with rheumatologic diseases (which have a relatively benign course in most cases) , adverse effect from  drugs such as chemotherapy or amiodarone exposure, nonspecific interstitial pneumonia which is typically steroid responsive, and chronic hypersensitivity pneumonitis.  Unfortunately little to offer in terms of immune modulating meds in setting of HIV - for now will follow with serial sats and pfts for firm idea of natural hx before considering any form of intervention here.  See instructions for specific recommendations which were reviewed directly with the patient who was given a copy with highlighter outlining the key components.

## 2013-09-12 NOTE — Progress Notes (Signed)
Subjective:     Patient ID: Paul [name], male   DOB: 06/30/1955    MRN: [id number]   Brief patient profile:  57 yobm quit smoking around 2002 with no resp problems then around 2007 sob much worse for sev weeks/months eventually  better not sure what rx but not eval by pulmonary or on pulmonary meds then that he recalls,  then recurrent indolent onset progressive doe x sev months  while not on any meds for HIV and referred to pulmonary clinic by Dr Ninetta LightsHatcher for eval 07/2010  With pft's showing restrictive changes 09/2010   HPI 08/15/2010 Initial pulmonary office eval   cc sob x sev months with exertion (walking flat x 50 ft) assoc with intermittent dry cough, still not back on HIV meds due to insurance issues, with last CD4 count 60 07/15/10 but no fever on prophylactic dose of bactrim. Sleeping ok without nocturnal  or early am exac of resp c/o's or need for noct saba.  rec You need to start your medications as soon as possible because you may have a low grade form of pcp pneumonia  10/01/2010 ov/Wert cc breathing and cough better on hiv rx. Here for f/u pfts. Not really limited by sob though no longer aerobically active. No purulent or excess sputum rec Return to this clinic if you feel you are losing any ground over the next 3 months in terms of your activity tolerance, but you should gradually improve to your satisfaction by the end of 3 months, if not please return here   06/23/2011 f/u ov/Wert cc much worse sob with "cold" x 3 months but now improving not using any inhalers, spont improved s rx and no longer coughing, no report of purulent sputum. rec No change rx, pft's on return  08/04/2011 f/u ov/Wert cc doe x 10 min treadmill 2 pm varying grade, minimal noct cough x a few min, not using any inhalers at all at this point rec To get the most out of exercise, you need to be continuously aware that you are short of breath, but never out of breath, for 30 minutes daily.   If loosing ground with  exercise tolerance we need to see you back here    06/07/2013 f/u ov/Wert re: HIV/ ILD  On no resp rx  Chief Complaint  Patient presents with  . Follow-up    Pt last seen 2013. He reports his breathing is no better or worse since then. No new co's today.    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 As you improve, it will actually be easier for you to do the same amount of exercise  in  30 minutes so always push to the level where you are short of breath.   Please schedule a follow up office visit in 6 weeks, call sooner if needed with pfts on return    07/26/2013 f/u ov/Wert re: PF Chief Complaint  Patient presents with  . Follow-up    Pt states that his breathing is unchanged. No new co's today.   Not limited by breathing from desired activities  Rec Pantoprazole (protonix) 40 mg   Take 30-60 min before first meal of the day and Pepcid 20 mg one bedtime until return to office - this is the best way to tell whether stomach acid is contributing to your problem.   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 Follow-up    Pt states breathing is unchanged. No new co's today.  stopped doing treadmill x 3 weeks, sob up 3 flights and dry cough at top Reports HIV now undetectable, no sweats.  No obvious day to day or daytime variabilty or assoc chronic cough or cp or chest tightness, subjective wheeze overt sinus or hb symptoms. No unusual exp hx or h/o childhood pna/ asthma or knowledge of premature birth.  Sleeping ok without nocturnal  or early am exacerbation  of respiratory  c/o's or need for noct  saba. Also denies any obvious fluctuation of symptoms with weather or environmental changes or other aggravating or alleviating factors except as outlined above   Current Medications, Allergies, Complete Past Medical History, Past Surgical History, Family History, and Social History were reviewed in [facility] electronic medical record.  ROS  The following are not active complaints unless bolded sore throat, dysphagia, dental problems, itching, sneezing,  nasal congestion or excess/ purulent secretions, ear ache,   fever, chills, sweats, unintended wt loss, pleuritic or exertional cp, hemoptysis,  orthopnea pnd or leg swelling, presyncope, palpitations, heartburn, abdominal pain, anorexia, nausea, vomiting, diarrhea  or change in bowel or urinary habits, change in stools or urine, dysuria,hematuria,  rash, arthralgias, visual complaints, headache, numbness weakness or ataxia or problems with walking or coordination,  change in mood/affect or memory.                     Objective:   Physical Exam   Pleasant bm amb nad    Wt 188 08/15/2010  > 189 10/01/2010 > 06/23/2011 215 > 08/04/2011  189 > 06/07/2013  185 >  07/26/2013 191> 09/12/2013  193   HEENT: nl dentition, turbinates, and orophanx. Nl external ear canals without cough reflex   NECK :  without JVD/Nodes/TM/ nl carotid upstrokes bilaterally   LUNGS: no acc muscle use, bilateral insp crackles with no cough on insp    CV:  RRR  no s3 or murmur or increase in P2, no edema   ABD:  soft and nontender with nl excursion in the supine position. No bruits or organomegaly, bowel sounds nl  MS:  warm without deformities, calf tenderness, cyanosis - no clubbing  SKIN: warm and dry without lesions    NEURO:  alert, approp, no deficits        06/02/13 Some progression of apparent chronic interstitial lung disease as  noted on prior CT.           Assessment:

## 2013-12-14 ENCOUNTER — Other Ambulatory Visit: Payer: Self-pay | Admitting: Ophthalmology

## 2013-12-26 ENCOUNTER — Other Ambulatory Visit: Payer: Commercial Managed Care - HMO

## 2013-12-26 DIAGNOSIS — B20 Human immunodeficiency virus [HIV] disease: Secondary | ICD-10-CM

## 2013-12-26 LAB — COMPLETE METABOLIC PANEL WITH GFR
ALK PHOS: 70 U/L (ref 39–117)
ALT: 69 U/L — ABNORMAL HIGH (ref 0–53)
AST: 54 U/L — ABNORMAL HIGH (ref 0–37)
Albumin: 4.4 g/dL (ref 3.5–5.2)
BUN: 14 mg/dL (ref 6–23)
CO2: 26 mEq/L (ref 19–32)
Calcium: 9.7 mg/dL (ref 8.4–10.5)
Chloride: 100 mEq/L (ref 96–112)
Creat: 0.95 mg/dL (ref 0.50–1.35)
GFR, EST NON AFRICAN AMERICAN: 88 mL/min
GFR, Est African American: >89 mL/min
GLUCOSE: 118 mg/dL — AB (ref 70–99)
Potassium: 4 mEq/L (ref 3.5–5.3)
SODIUM: 139 meq/L (ref 135–145)
Total Bilirubin: 0.8 mg/dL (ref 0.2–1.2)
Total Protein: 7.7 g/dL (ref 6.0–8.3)

## 2013-12-26 LAB — CBC WITH DIFFERENTIAL/PLATELET
BASOS ABS: 0 10*3/uL (ref 0.0–0.1)
Basophils Relative: 1 % (ref 0–1)
EOS ABS: 0.2 10*3/uL (ref 0.0–0.7)
Eosinophils Relative: 5 % (ref 0–5)
HCT: 43.5 % (ref 39.0–52.0)
Hemoglobin: 15.4 g/dL (ref 13.0–17.0)
LYMPHS ABS: 1.4 10*3/uL (ref 0.7–4.0)
LYMPHS PCT: 30 % (ref 12–46)
MCH: 37.5 pg — ABNORMAL HIGH (ref 26.0–34.0)
MCHC: 35.4 g/dL (ref 30.0–36.0)
MCV: 105.8 fL — ABNORMAL HIGH (ref 78.0–100.0)
Monocytes Absolute: 0.5 10*3/uL (ref 0.1–1.0)
Monocytes Relative: 10 % (ref 3–12)
NEUTROS PCT: 54 % (ref 43–77)
Neutro Abs: 2.5 10*3/uL (ref 1.7–7.7)
PLATELETS: 223 10*3/uL (ref 150–400)
RBC: 4.11 MIL/uL — AB (ref 4.22–5.81)
RDW: 14.6 % (ref 11.5–15.5)
WBC: 4.7 10*3/uL (ref 4.0–10.5)

## 2013-12-26 NOTE — Addendum Note (Signed)
Addended by: Mariea ClontsGREEN, Eulanda Dorion D on: 12/26/2013 09:57 AM   Modules accepted: Orders

## 2013-12-27 LAB — HIV-1 RNA QUANT-NO REFLEX-BLD
HIV 1 RNA Quant: <20 copies/mL (ref <20)
HIV-1 RNA Quant, Log: <1.3 {Log} (ref <1.30)

## 2013-12-27 LAB — HEPATITIS C ANTIBODY: HCV AB: NEGATIVE

## 2013-12-27 LAB — T-HELPER CELL (CD4) - (RCID CLINIC ONLY)
CD4 T CELL HELPER: 17 % — AB (ref 33–55)
CD4 T Cell Abs: 220 /uL — ABNORMAL LOW (ref 400–2700)

## 2013-12-30 ENCOUNTER — Other Ambulatory Visit: Payer: Self-pay

## 2014-01-05 ENCOUNTER — Ambulatory Visit: Payer: Medicare HMO | Admitting: Infectious Disease

## 2014-01-09 ENCOUNTER — Ambulatory Visit: Payer: Medicare HMO | Admitting: Infectious Disease

## 2014-01-18 ENCOUNTER — Other Ambulatory Visit: Payer: Self-pay | Admitting: Internal Medicine

## 2014-01-18 DIAGNOSIS — J8410 Pulmonary fibrosis, unspecified: Secondary | ICD-10-CM

## 2014-01-19 ENCOUNTER — Ambulatory Visit (INDEPENDENT_AMBULATORY_CARE_PROVIDER_SITE_OTHER): Payer: Commercial Managed Care - HMO | Admitting: Internal Medicine

## 2014-01-19 ENCOUNTER — Ambulatory Visit (INDEPENDENT_AMBULATORY_CARE_PROVIDER_SITE_OTHER): Payer: Commercial Managed Care - HMO | Admitting: *Deleted

## 2014-01-19 ENCOUNTER — Encounter: Payer: Self-pay | Admitting: Internal Medicine

## 2014-01-19 VITALS — BP 110/68 | HR 62 | Ht 68.0 in | Wt 182.0 lb

## 2014-01-19 DIAGNOSIS — J8410 Pulmonary fibrosis, unspecified: Secondary | ICD-10-CM

## 2014-01-19 DIAGNOSIS — Z23 Encounter for immunization: Secondary | ICD-10-CM

## 2014-01-19 LAB — PULMONARY FUNCTION TEST
DL/VA % pred: 98 %
DL/VA: 4.45 ml/min/mmHg/L
DLCO UNC % PRED: 48 %
DLCO UNC: 14.49 ml/min/mmHg
FEF 25-75 PRE: 2.24 L/s
FEF 25-75 Post: 3.21 L/sec
FEF2575-%Change-Post: 43 %
FEF2575-%PRED-POST: 115 %
FEF2575-%Pred-Pre: 80 %
FEV1-%CHANGE-POST: 6 %
FEV1-%PRED-POST: 65 %
FEV1-%Pred-Pre: 60 %
FEV1-POST: 1.9 L
FEV1-Pre: 1.78 L
FEV1FVC-%CHANGE-POST: 1 %
FEV1FVC-%Pred-Pre: 109 %
FEV6-%Change-Post: 4 %
FEV6-%Pred-Post: 60 %
FEV6-%Pred-Pre: 57 %
FEV6-POST: 2.18 L
FEV6-PRE: 2.08 L
FEV6FVC-%PRED-POST: 104 %
FEV6FVC-%PRED-PRE: 104 %
FVC-%Change-Post: 4 %
FVC-%Pred-Post: 58 %
FVC-%Pred-Pre: 55 %
FVC-PRE: 2.08 L
FVC-Post: 2.18 L
POST FEV6/FVC RATIO: 100 %
PRE FEV6/FVC RATIO: 100 %
Post FEV1/FVC ratio: 87 %
Pre FEV1/FVC ratio: 86 %
RV % PRED: 67 %
RV: 1.43 L
TLC % PRED: 51 %
TLC: 3.41 L

## 2014-01-19 NOTE — Progress Notes (Signed)
PFT done today. 

## 2014-01-19 NOTE — Progress Notes (Signed)
Subjective:     Patient ID: Paul [name], male   DOB: Jan 24, 1956    MRN: [id number]   Brief patient profile:  57 yobm quit smoking around 2002 with no resp problems then around 2007 sob much worse for sev weeks/months eventually  better not sure what rx but not eval by pulmonary or on pulmonary meds then that he recalls,  then recurrent indolent onset progressive doe x sev months  while not on any meds for HIV and referred to pulmonary clinic by Dr Ninetta LightsHatcher for eval 07/2010  With pft's showing restrictive changes 09/2010   HPI 08/15/2010 Initial pulmonary office eval   cc sob x sev months with exertion (walking flat x 50 ft) assoc with intermittent dry cough, still not back on HIV meds due to insurance issues, with last CD4 count 60 07/15/10 but no fever on prophylactic dose of bactrim. Sleeping ok without nocturnal  or early am exac of resp c/o's or need for noct saba.  rec You need to start your medications as soon as possible because you may have a low grade form of pcp pneumonia  10/01/2010 ov/[name] cc breathing and cough better on hiv rx. Here for f/u pfts. Not really limited by sob though no longer aerobically active. No purulent or excess sputum rec Return to this clinic if you feel you are losing any ground over the next 3 months in terms of your activity tolerance, but you should gradually improve to your satisfaction by the end of 3 months, if not please return here   06/23/2011 f/u ov/[name] cc much worse sob with "cold" x 3 months but now improving not using any inhalers, spont improved s rx and no longer coughing, no report of purulent sputum. rec No change rx, pft's on return  08/04/2011 f/u ov/[name] cc doe x 10 min treadmill 2 pm varying grade, minimal noct cough x a few min, not using any inhalers at all at this point rec To get the most out of exercise, you need to be continuously aware that you are short of breath, but never out of breath, for 30 minutes daily.   If loosing ground with  exercise tolerance we need to see you back here    06/07/2013 f/u ov/[name] re: HIV/ ILD  On no resp rx  Chief Complaint  Patient presents with  . Follow-up    Pt last seen 2013. He reports his breathing is no better or worse since then. No new co's today.    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 As you improve, it will actually be easier for you to do the same amount of exercise  in  30 minutes so always push to the level where you are short of breath.   Please schedule a follow up office visit in 6 weeks, call sooner if needed with pfts on return    07/26/2013 f/u ov/[name] re: PF/ no resp rx  Chief Complaint  Patient presents with  . Follow-up    Pt states that his breathing is unchanged. No new co's today.   Not limited by breathing  from desired activities   Rec Pantoprazole (protonix) 40 mg   Take 30-60 min before first meal of the day and Pepcid 20 mg one bedtime until return to office - this is the best way to tell whether stomach acid is contributing to your problem.   GERD  Diet       01/19/2014 f/u ov/[name] re: PF Chief Complaint  Patient presents with  . Follow-up    PFT done today. Breathing is doing well and no new co's today.   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 No unusual exp hx or h/o childhood pna/ asthma or knowledge of premature birth.  Sleeping ok without nocturnal  or early am exacerbation  of respiratory  c/o's or need for noct saba. Also denies any obvious fluctuation of symptoms with  weather or environmental changes or other aggravating or alleviating factors except as outlined above   Current Medications, Allergies, Complete Past Medical History, Past Surgical History, Family History, and Social History were reviewed in [facility] electronic medical record.  ROS  The following are not active complaints unless bolded sore throat, dysphagia, dental problems, itching, sneezing,  nasal congestion or excess/ purulent secretions, ear ache,   fever, chills, sweats, unintended wt loss, pleuritic or exertional cp, hemoptysis,  orthopnea pnd or leg swelling, presyncope, palpitations, heartburn, abdominal pain, anorexia, nausea, vomiting, diarrhea  or change in bowel or urinary habits, change in stools or urine, dysuria,hematuria,  rash, arthralgias, visual complaints, headache, numbness weakness or ataxia or problems with walking or coordination,  change in mood/affect or memory.                     Objective:   Physical Exam   Pleasant bm amb nad    Wt 188 08/15/2010  > 189 10/01/2010 > 06/23/2011 215 > 08/04/2011  189 > 06/07/2013  185 >  07/26/2013 191> 09/12/2013  193 > 01/19/2014 182   HEENT: nl dentition, turbinates, and orophanx. Nl external ear canals without cough reflex   NECK :  without JVD/Nodes/TM/ nl carotid upstrokes bilaterally   LUNGS: no acc muscle use, bilateral insp crackles with no cough on insp    CV:  RRR  no s3 or murmur or increase in P2, no edema   ABD:  soft and nontender with nl excursion in the supine position. No bruits or organomegaly, bowel sounds nl  MS:  warm without deformities, calf tenderness, cyanosis - no clubbing           06/02/13 Some progression of apparent chronic interstitial lung disease as  noted on prior CT.           Assessment:

## 2014-01-19 NOTE — Patient Instructions (Addendum)
No change recommendations on reflux meds and reflux diet   Please schedule a follow up visit in 12 months but call sooner if needed if you feel like you are loosing any ground with your exercise tolerance due to breathing limitation

## 2014-01-22 NOTE — Assessment & Plan Note (Signed)
-    Walked one lap @ 185 stopped due to  desat to 87 RA  08/15/10    - PFT's 10/01/2010  FEV1  1.98 (59%) ratio 82 and DLC0  36% corrects to 82    - PFT's 08/04/2011  FEV1 2.15 (65%) ratio 80 and no better with B2,  DLCO 47%  Max FVC  2.55     - PFTs  07/26/13      FEV1 1.84 (62%) ratio 88 and no change with B2 DLCO 47%  Max FVC  2.24     - PFTs  01/19/2014 FEV1  1.94 (65%) ratio 87 and no change p B2 with DLCO 48%   Max FVC is 2.18    - 08/04/2011  Walked RA x 3 laps @ 185 ft each stopped due to  End of study no desat    - 07/26/2013  Walked RA x 3 laps @ 185 ft each stopped due to end of study sats 91%    - 09/12/2013  Walked RA x 3 laps @ 185 ft each stopped due to  Sat 90% at end of study but no sob   Really very little change in VC x 2 years and well less than 10% x last 6 m so this plus the HIV status make it very unlikely he would be a candidate for any of the PF drugs available (I don't think HIV pts were in the trials that showed limited benefits even in the nl population)  He's clinically doing better on rx for gerd based on:  se of PPI is associated with improved survival time and with decreased radiologic fibrosis per King's study published in AJRCCM vol 184 p1390.  Dec 2011  This may not be cause and effect, but given how universally unhelpful all the otherstudy drugs have been for pf,   rec continue t  rx ppi / diet/ lifestyle modification.

## 2014-02-06 ENCOUNTER — Ambulatory Visit (INDEPENDENT_AMBULATORY_CARE_PROVIDER_SITE_OTHER): Payer: Medicare HMO | Admitting: Infectious Disease

## 2014-02-06 ENCOUNTER — Encounter: Payer: Self-pay | Admitting: Infectious Disease

## 2014-02-06 VITALS — BP 119/80 | HR 64 | Temp 97.9°F | Wt 184.0 lb

## 2014-02-06 DIAGNOSIS — D893 Immune reconstitution syndrome: Secondary | ICD-10-CM

## 2014-02-06 DIAGNOSIS — J984 Other disorders of lung: Secondary | ICD-10-CM

## 2014-02-06 DIAGNOSIS — B20 Human immunodeficiency virus [HIV] disease: Secondary | ICD-10-CM

## 2014-02-06 DIAGNOSIS — B0052 Herpesviral keratitis: Secondary | ICD-10-CM

## 2014-02-06 DIAGNOSIS — N521 Erectile dysfunction due to diseases classified elsewhere: Secondary | ICD-10-CM

## 2014-02-06 DIAGNOSIS — A419 Sepsis, unspecified organism: Secondary | ICD-10-CM

## 2014-02-06 NOTE — Progress Notes (Signed)
  Subjective:    Patient ID: Paul ScalesJuan B Domingo, male    DOB: 1955-04-11, 58 y.o.   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  His VL is <20 and CD4>200.  Lab Results  Component Value Date   HIV1RNAQUANT <20 12/26/2013   Lab Results  Component Value Date   CD4TABS 220* 12/26/2013   CD4TABS 200* 06/06/2013   CD4TABS 230* 02/21/2013      Review of Systems  Constitutional: Negative for fever, chills, diaphoresis, activity change, appetite change, fatigue and unexpected weight change.  HENT: Negative for congestion, rhinorrhea, sinus pressure, sneezing, sore throat and trouble swallowing.   Eyes: Negative for photophobia and visual disturbance.  Respiratory: Negative for cough, chest tightness, shortness of breath, wheezing and stridor.   Cardiovascular: Negative for chest pain, palpitations and leg swelling.  Gastrointestinal: Negative for nausea, vomiting, abdominal pain, diarrhea, constipation, blood in stool, abdominal distention and anal bleeding.  Genitourinary: Negative for dysuria, hematuria, flank pain and difficulty urinating.  Musculoskeletal: Negative for myalgias, back pain, joint swelling, arthralgias and gait problem.  Skin: Negative for color change, pallor, rash and wound.  Neurological: Negative for dizziness, tremors, weakness and light-headedness.  Hematological: Negative for adenopathy. Does not bruise/bleed easily.  Psychiatric/Behavioral: Negative for behavioral problems, confusion, sleep disturbance, dysphoric mood, decreased concentration and agitation.       Objective:   Physical Exam  Constitutional: He is oriented to person, place, and time. He appears well-developed and well-nourished. No distress.  HENT:  Head: Normocephalic and atraumatic.  Mouth/Throat:  Oropharynx is clear and moist. No oropharyngeal exudate.  Eyes: EOM are normal.  Neck: Normal range of motion. Neck supple. No JVD present.  Cardiovascular: Normal rate, regular rhythm and normal heart sounds.  Exam reveals no gallop and no friction rub.   No murmur heard. Pulmonary/Chest: Effort normal and breath sounds normal. No respiratory distress. He has no wheezes. He has no rales.  Abdominal: Soft. Bowel sounds are normal. He exhibits no distension. There is no tenderness. There is no rebound.  Musculoskeletal: He exhibits no edema or tenderness.  Lymphadenopathy:    He has no cervical adenopathy.  Neurological: He is alert and oriented to person, place, and time. He exhibits normal muscle tone. Coordination normal.  Skin: Skin is warm and dry. He is not diaphoretic. No erythema. No pallor.  Psychiatric: He has a normal mood and affect. His behavior is normal. Judgment and thought content normal.            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 Sherene SiresWert and CCM

## 2014-03-02 ENCOUNTER — Other Ambulatory Visit: Payer: Self-pay | Admitting: Internal Medicine

## 2014-03-02 ENCOUNTER — Ambulatory Visit
Admission: RE | Admit: 2014-03-02 | Discharge: 2014-03-02 | Disposition: A | Discharge status: Home / Self Care | Payer: Commercial Managed Care - HMO | Source: Ambulatory Visit | Attending: Internal Medicine | Admitting: Internal Medicine

## 2014-03-02 DIAGNOSIS — M2550 Pain in unspecified joint: Secondary | ICD-10-CM

## 2014-03-06 ENCOUNTER — Other Ambulatory Visit: Payer: Self-pay | Admitting: Infectious Disease

## 2014-04-01 ENCOUNTER — Other Ambulatory Visit: Payer: Self-pay | Admitting: Infectious Disease

## 2014-04-01 DIAGNOSIS — B20 Human immunodeficiency virus [HIV] disease: Secondary | ICD-10-CM

## 2014-05-01 ENCOUNTER — Other Ambulatory Visit: Payer: Self-pay | Admitting: *Deleted

## 2014-05-01 DIAGNOSIS — B009 Herpesviral infection, unspecified: Secondary | ICD-10-CM

## 2014-05-01 MED ORDER — VALACYCLOVIR HCL 1 G PO TABS: 1000.0000 mg | ORAL_TABLET | Freq: Every day | ORAL | Status: DC

## 2014-05-02 ENCOUNTER — Other Ambulatory Visit: Payer: Self-pay | Admitting: *Deleted

## 2014-05-02 DIAGNOSIS — B009 Herpesviral infection, unspecified: Secondary | ICD-10-CM

## 2014-05-02 MED ORDER — VALACYCLOVIR HCL 1 G PO TABS: 1000.0000 mg | ORAL_TABLET | Freq: Every day | ORAL | Status: DC

## 2014-07-24 ENCOUNTER — Other Ambulatory Visit: Payer: Medicare PPO

## 2014-07-24 DIAGNOSIS — F528 Other sexual dysfunction not due to a substance or known physiological condition: Secondary | ICD-10-CM

## 2014-07-24 LAB — CBC WITH DIFFERENTIAL/PLATELET
BASOS PCT: 1 % (ref 0–1)
Basophils Absolute: 0 10*3/uL (ref 0.0–0.1)
EOS ABS: 0.2 10*3/uL (ref 0.0–0.7)
Eosinophils Relative: 4 % (ref 0–5)
HCT: 43 % (ref 39.0–52.0)
HEMOGLOBIN: 14.8 g/dL (ref 13.0–17.0)
Lymphocytes Relative: 28 % (ref 12–46)
Lymphs Abs: 1.2 10*3/uL (ref 0.7–4.0)
MCH: 37.3 pg — AB (ref 26.0–34.0)
MCHC: 34.4 g/dL (ref 30.0–36.0)
MCV: 108.3 fL — ABNORMAL HIGH (ref 78.0–100.0)
MONO ABS: 0.4 10*3/uL (ref 0.1–1.0)
MONOS PCT: 8 % (ref 3–12)
MPV: 10.2 fL (ref 8.6–12.4)
Neutro Abs: 2.6 10*3/uL (ref 1.7–7.7)
Neutrophils Relative %: 59 % (ref 43–77)
Platelets: 204 10*3/uL (ref 150–400)
RBC: 3.97 MIL/uL — ABNORMAL LOW (ref 4.22–5.81)
RDW: 14 % (ref 11.5–15.5)
WBC: 4.4 10*3/uL (ref 4.0–10.5)

## 2014-07-24 LAB — COMPLETE METABOLIC PANEL WITH GFR
ALT: 23 U/L (ref 0–53)
AST: 21 U/L (ref 0–37)
Albumin: 4.2 g/dL (ref 3.5–5.2)
Alkaline Phosphatase: 70 U/L (ref 39–117)
BUN: 10 mg/dL (ref 6–23)
CALCIUM: 9.4 mg/dL (ref 8.4–10.5)
CO2: 29 meq/L (ref 19–32)
CREATININE: 0.94 mg/dL (ref 0.50–1.35)
Chloride: 105 mEq/L (ref 96–112)
GFR, Est Non African American: 88 mL/min
Glucose, Bld: 122 mg/dL — ABNORMAL HIGH (ref 70–99)
Potassium: 4.4 mEq/L (ref 3.5–5.3)
SODIUM: 141 meq/L (ref 135–145)
TOTAL PROTEIN: 7.6 g/dL (ref 6.0–8.3)
Total Bilirubin: 0.5 mg/dL (ref 0.2–1.2)

## 2014-07-25 LAB — T-HELPER CELLS (CD4) COUNT (NOT AT ARMC)
CD4 T CELL ABS: 260 /uL — AB (ref 400–2700)
CD4 T CELL HELPER: 20 % — AB (ref 33–55)

## 2014-07-25 LAB — HIV-1 RNA QUANT-NO REFLEX-BLD

## 2014-08-07 ENCOUNTER — Ambulatory Visit: Payer: Commercial Managed Care - HMO | Admitting: Infectious Disease

## 2014-08-21 ENCOUNTER — Ambulatory Visit (INDEPENDENT_AMBULATORY_CARE_PROVIDER_SITE_OTHER): Payer: Medicare PPO | Admitting: Infectious Disease

## 2014-08-21 ENCOUNTER — Other Ambulatory Visit (HOSPITAL_COMMUNITY)
Admission: RE | Admit: 2014-08-21 | Discharge: 2014-08-21 | Disposition: A | Discharge status: Home / Self Care | Payer: Medicare PPO | Source: Ambulatory Visit | Attending: Infectious Disease | Admitting: Infectious Disease

## 2014-08-21 ENCOUNTER — Encounter: Payer: Self-pay | Admitting: Infectious Disease

## 2014-08-21 VITALS — BP 147/96 | HR 67 | Temp 98.1°F | Wt 193.0 lb

## 2014-08-21 DIAGNOSIS — H01013 Ulcerative blepharitis right eye, unspecified eyelid: Secondary | ICD-10-CM

## 2014-08-21 DIAGNOSIS — H05013 Cellulitis of bilateral orbits: Secondary | ICD-10-CM

## 2014-08-21 DIAGNOSIS — A419 Sepsis, unspecified organism: Secondary | ICD-10-CM

## 2014-08-21 DIAGNOSIS — B0052 Herpesviral keratitis: Secondary | ICD-10-CM | POA: Diagnosis not present

## 2014-08-21 DIAGNOSIS — J984 Other disorders of lung: Secondary | ICD-10-CM

## 2014-08-21 DIAGNOSIS — B20 Human immunodeficiency virus [HIV] disease: Secondary | ICD-10-CM | POA: Diagnosis not present

## 2014-08-21 DIAGNOSIS — R609 Edema, unspecified: Secondary | ICD-10-CM

## 2014-08-21 DIAGNOSIS — I5032 Chronic diastolic (congestive) heart failure: Secondary | ICD-10-CM

## 2014-08-21 DIAGNOSIS — Z113 Encounter for screening for infections with a predominantly sexual mode of transmission: Secondary | ICD-10-CM | POA: Diagnosis present

## 2014-08-21 DIAGNOSIS — D893 Immune reconstitution syndrome: Secondary | ICD-10-CM

## 2014-08-21 NOTE — Progress Notes (Signed)
Subjective:    Patient ID: Paul [name], male    DOB: 08/01/1955, 59 y.o.   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  His VL is <20 and CD4>200.  Lab Results  Component Value Date   HIV1RNAQUANT <20 07/24/2014   Lab Results  Component Value Date   CD4TABS 260* 07/24/2014   CD4TABS 220* 12/26/2013   CD4TABS 200* 06/06/2013   He is following with Dr Nehemiah Settle for Primary Care. Pt himself has now moved to [place] but wishes to continue his HIV care here in [place].  He still has trouble at times of his right eye tearing up esp with allergens despite surgery. No recurrence of his HSV2 eye infection as of late.   Review of Systems  Constitutional: Negative for fever, chills, diaphoresis, activity change, appetite change, fatigue and unexpected weight change.  HENT: Negative for congestion, rhinorrhea, sinus pressure, sneezing, sore throat and trouble swallowing.   Eyes: Negative for photophobia and visual disturbance.  Respiratory: Negative for cough, chest tightness, shortness of breath, wheezing and stridor.   Cardiovascular: Negative for chest pain, palpitations and leg swelling.  Gastrointestinal: Negative for nausea, vomiting, abdominal pain, diarrhea, constipation, blood in stool, abdominal distention and anal bleeding.  Genitourinary: Negative for dysuria, hematuria, flank pain and difficulty urinating.  Musculoskeletal: Negative for myalgias, back pain, joint swelling, arthralgias and gait problem.  Skin: Negative for color change, pallor, rash and wound.  Neurological: Negative for dizziness, tremors, weakness and light-headedness.  Hematological: Negative for adenopathy. Does not bruise/bleed easily.  Psychiatric/Behavioral: Negative for behavioral problems, confusion, sleep  disturbance, dysphoric mood, decreased concentration and agitation.       Objective:   Physical Exam  Constitutional: He is oriented to person, place, and time. He appears well-developed and well-nourished. No distress.  HENT:  Head: Normocephalic and atraumatic.  Mouth/Throat: Oropharynx is clear and moist. No oropharyngeal exudate.  Eyes: EOM are normal.  Neck: Normal range of motion. Neck supple. No JVD present.  Cardiovascular: Normal rate, regular rhythm and normal heart sounds.  Exam reveals no gallop and no friction rub.   No murmur heard. Pulmonary/Chest: Effort normal and breath sounds normal. No respiratory distress. He has no wheezes. He has no rales.  Abdominal: Soft. Bowel sounds are normal. He exhibits no distension. There is no tenderness. There is no rebound.  Musculoskeletal: He exhibits no edema or tenderness.  Lymphadenopathy:    He has no cervical adenopathy.  Neurological: He is alert and oriented to person, place, and time. He exhibits normal muscle tone. Coordination normal.  Skin: Skin is warm and dry. He is not diaphoretic. No erythema. No pallor.  Psychiatric: He has a normal mood and affect. His behavior is normal. Judgment and thought content normal.            Assessment & Plan:   #1: HIV: Perfect virological suppression, continue current regimen 6 months time. I spent greater than 25 minutes with the patient including greater than 50% of time in face to face counsel of the patient and in coordination of their care.    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 Sherene Sires and CCM  #5 HTN and chronic diastolic heart  failure: follwed by Dr. Nehemiah SettlePolite

## 2014-08-22 LAB — MICROALBUMIN / CREATININE URINE RATIO
CREATININE, URINE: 109 mg/dL
MICROALB UR: 0.3 mg/dL (ref <2.0)
MICROALB/CREAT RATIO: 2.8 mg/g (ref 0.0–30.0)

## 2014-08-23 LAB — URINE CYTOLOGY ANCILLARY ONLY
Chlamydia: NEGATIVE
Neisseria Gonorrhea: NEGATIVE

## 2014-08-24 ENCOUNTER — Other Ambulatory Visit: Payer: Self-pay | Admitting: Licensed Clinical Social Worker

## 2014-08-24 MED ORDER — LAMIVUDINE-ZIDOVUDINE 150-300 MG PO TABS: 1.0000 | ORAL_TABLET | Freq: Two times a day (BID) | ORAL | Status: DC

## 2014-09-15 ENCOUNTER — Other Ambulatory Visit: Payer: Self-pay | Admitting: Infectious Disease

## 2014-09-15 DIAGNOSIS — B20 Human immunodeficiency virus [HIV] disease: Secondary | ICD-10-CM

## 2014-09-21 DIAGNOSIS — B009 Herpesviral infection, unspecified: Secondary | ICD-10-CM | POA: Insufficient documentation

## 2014-09-21 DIAGNOSIS — B20 Human immunodeficiency virus [HIV] disease: Secondary | ICD-10-CM | POA: Insufficient documentation

## 2014-09-21 DIAGNOSIS — Z87891 Personal history of nicotine dependence: Secondary | ICD-10-CM | POA: Insufficient documentation

## 2014-09-21 DIAGNOSIS — Z0000 Encounter for general adult medical examination without abnormal findings: Secondary | ICD-10-CM | POA: Insufficient documentation

## 2014-12-06 DIAGNOSIS — E7800 Pure hypercholesterolemia, unspecified: Secondary | ICD-10-CM | POA: Insufficient documentation

## 2014-12-06 DIAGNOSIS — K644 Residual hemorrhoidal skin tags: Secondary | ICD-10-CM | POA: Insufficient documentation

## 2015-02-21 ENCOUNTER — Ambulatory Visit: Payer: Commercial Managed Care - HMO | Admitting: Infectious Disease

## 2015-02-26 ENCOUNTER — Ambulatory Visit: Payer: Commercial Managed Care - HMO | Admitting: Infectious Disease

## 2015-03-22 ENCOUNTER — Ambulatory Visit (INDEPENDENT_AMBULATORY_CARE_PROVIDER_SITE_OTHER): Payer: Medicare PPO | Admitting: Infectious Disease

## 2015-03-22 ENCOUNTER — Encounter: Payer: Self-pay | Admitting: Infectious Disease

## 2015-03-22 VITALS — BP 126/88 | HR 88 | Temp 97.8°F | Wt 192.0 lb

## 2015-03-22 DIAGNOSIS — B0052 Herpesviral keratitis: Secondary | ICD-10-CM

## 2015-03-22 DIAGNOSIS — D893 Immune reconstitution syndrome: Secondary | ICD-10-CM

## 2015-03-22 DIAGNOSIS — I5032 Chronic diastolic (congestive) heart failure: Secondary | ICD-10-CM

## 2015-03-22 DIAGNOSIS — B20 Human immunodeficiency virus [HIV] disease: Secondary | ICD-10-CM

## 2015-03-22 DIAGNOSIS — B59 Pneumocystosis: Secondary | ICD-10-CM | POA: Diagnosis not present

## 2015-03-22 DIAGNOSIS — B303 Acute epidemic hemorrhagic conjunctivitis (enteroviral): Secondary | ICD-10-CM

## 2015-03-22 DIAGNOSIS — J8410 Pulmonary fibrosis, unspecified: Secondary | ICD-10-CM

## 2015-03-22 LAB — COMPLETE METABOLIC PANEL WITH GFR
ALT: 23 U/L (ref 9–46)
AST: 24 U/L (ref 10–35)
Albumin: 4.1 g/dL (ref 3.6–5.1)
Alkaline Phosphatase: 65 U/L (ref 40–115)
BILIRUBIN TOTAL: 0.5 mg/dL (ref 0.2–1.2)
BUN: 9 mg/dL (ref 7–25)
CO2: 23 mmol/L (ref 20–31)
CREATININE: 0.93 mg/dL (ref 0.70–1.25)
Calcium: 9.3 mg/dL (ref 8.6–10.3)
Chloride: 103 mmol/L (ref 98–110)
GFR, Est African American: >89 mL/min (ref >=60)
GFR, Est Non African American: 89 mL/min (ref >=60)
Glucose, Bld: 102 mg/dL — ABNORMAL HIGH (ref 65–99)
Potassium: 3.9 mmol/L (ref 3.5–5.3)
Sodium: 136 mmol/L (ref 135–146)
TOTAL PROTEIN: 7.2 g/dL (ref 6.1–8.1)

## 2015-03-22 LAB — LIPID PANEL
Cholesterol: 172 mg/dL (ref 125–200)
HDL: 42 mg/dL (ref >=40)
LDL Cholesterol: 108 mg/dL (ref <130)
Total CHOL/HDL Ratio: 4.1 Ratio (ref <=5.0)
Triglycerides: 112 mg/dL (ref <150)
VLDL: 22 mg/dL (ref <30)

## 2015-03-22 LAB — CBC WITH DIFFERENTIAL/PLATELET
Basophils Absolute: 0 10*3/uL (ref 0.0–0.1)
Basophils Relative: 0 % (ref 0–1)
EOS PCT: 6 % — AB (ref 0–5)
Eosinophils Absolute: 0.4 10*3/uL (ref 0.0–0.7)
HCT: 38.2 % — ABNORMAL LOW (ref 39.0–52.0)
HEMOGLOBIN: 12.7 g/dL — AB (ref 13.0–17.0)
LYMPHS ABS: 1.6 10*3/uL (ref 0.7–4.0)
Lymphocytes Relative: 27 % (ref 12–46)
MCH: 34.3 pg — AB (ref 26.0–34.0)
MCHC: 33.2 g/dL (ref 30.0–36.0)
MCV: 103.2 fL — ABNORMAL HIGH (ref 78.0–100.0)
MPV: 10.3 fL (ref 8.6–12.4)
Monocytes Absolute: 0.7 10*3/uL (ref 0.1–1.0)
Monocytes Relative: 11 % (ref 3–12)
NEUTROS PCT: 56 % (ref 43–77)
Neutro Abs: 3.4 10*3/uL (ref 1.7–7.7)
Platelets: 326 10*3/uL (ref 150–400)
RBC: 3.7 MIL/uL — ABNORMAL LOW (ref 4.22–5.81)
RDW: 12.5 % (ref 11.5–15.5)
WBC: 6 10*3/uL (ref 4.0–10.5)

## 2015-03-22 NOTE — Progress Notes (Signed)
Chief complaint: followup for HIV   Subjective:    Patient ID: Paul [name], male    DOB: 1955-07-06, 60 y.o.   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  His VL is <20 and CD4>200.   Pt himself has moved to [place] but wishes to continue his HIV care here in [place]. HIs primary care in [place] is working up his persistent dyspnea.   Past Medical History  Diagnosis Date  . HIV (human immunodeficiency virus infection) (HCC)   . Diastolic heart failure (HCC)   . Herpes simplex virus (HSV) epithelial keratitis   . IRIS (immune reconstitution inflammatory syndrome) (HCC)   . Hypertension   . Depression     History reviewed. No pertinent past surgical history.  No family history on file.    Social History   Social History  . Marital Status: Single    Spouse Name: N/A  . Number of Children: N/A  . Years of Education: N/A   Occupational History  . Retail sales    Social History Main Topics  . Smoking status: Former Smoker -- 0.25 packs/day for 20 years    Types: Cigarettes    Quit date: 03/17/1996  . Smokeless tobacco: Never Used  . Alcohol Use: 1.0 oz/week    2 drink(s) per week     Comment: seldom  . Drug Use: No  . Sexual Activity:    Partners: Female     Comment: pt. given condoms   Other Topics Concern  . None   Social History Narrative    Allergies  Allergen Reactions  . Banana Nausea And Vomiting     Current outpatient prescriptions:  .  ferrous sulfate 325 (65 FE) MG EC tablet, TAKING BID, Disp: 60 tablet, Rfl: 11 .  furosemide (LASIX) 40 MG tablet, Take 1 tablet (40 mg total) by mouth daily., Disp: 90 tablet, Rfl: 3 .  INTELENCE 200 MG TABS, TAKE 1 TABLET BY MOUTH TWICE DAILY, Disp: 60 tablet, Rfl: 5 .  ISENTRESS 400 MG tablet, TAKE 1 TABLET BY MOUTH  TWICE DAILY, Disp: 60 tablet, Rfl: 5 .  lamiVUDine-zidovudine (COMBIVIR) 150-300 MG per tablet, Take 1 tablet by mouth 2 (two) times daily., Disp: 180 tablet, Rfl: 5 .  pantoprazole (PROTONIX) 40 MG tablet, Take 1 tablet (40 mg total) by mouth daily. Take 30-60 min before first meal of the day, Disp: 30 tablet, Rfl: 2 .  potassium chloride (K-DUR,KLOR-CON) 10 MEQ tablet, Take 1 tablet (10 mEq total) by mouth daily., Disp: 30 tablet, Rfl: 6 .  tadalafil (CIALIS) 10 MG tablet, Take 1 tablet (10 mg total) by mouth daily as needed for erectile dysfunction. (Patient not taking: Reported on 08/21/2014), Disp: 3 tablet, Rfl: 4 .  valACYclovir (VALTREX) 1000 MG tablet, Take 1 tablet (1,000 mg total) by mouth daily., Disp: 30 tablet, Rfl: 11   Lab Results  Component Value Date   HIV1RNAQUANT <20 07/24/2014   Lab Results  Component Value Date   CD4TABS 260* 07/24/2014   CD4TABS 220* 12/26/2013   CD4TABS 200* 06/06/2013     Review of Systems  Constitutional: Negative for fever, chills, diaphoresis, activity change, appetite change, fatigue and unexpected weight change.  HENT: Negative for congestion, rhinorrhea, sinus pressure, sneezing, sore throat and trouble swallowing.   Eyes: Negative for photophobia and visual  disturbance.  Respiratory: Positive for shortness of breath. Negative for cough, chest tightness, wheezing and stridor.   Cardiovascular: Negative for chest pain, palpitations and leg swelling.  Gastrointestinal: Negative for nausea, vomiting, abdominal pain, diarrhea, constipation, blood in stool and abdominal distention.  Genitourinary: Negative for dysuria, hematuria, flank pain and difficulty urinating.  Musculoskeletal: Negative for myalgias, back pain, joint swelling, arthralgias and gait problem.  Skin: Negative for color change, pallor, rash and wound.  Neurological: Negative for dizziness, tremors, weakness and light-headedness.  Hematological: Negative for adenopathy. Does not  bruise/bleed easily.  Psychiatric/Behavioral: Negative for behavioral problems, confusion, sleep disturbance, dysphoric mood, decreased concentration and agitation.       Objective:   Physical Exam  Constitutional: He is oriented to person, place, and time. He appears well-developed and well-nourished. No distress.  HENT:  Head: Normocephalic and atraumatic.  Mouth/Throat: Oropharynx is clear and moist. No oropharyngeal exudate.  Eyes: EOM are normal.  Neck: Normal range of motion. Neck supple. No JVD present.  Cardiovascular: Normal rate, regular rhythm and normal heart sounds.  Exam reveals no gallop and no friction rub.   No murmur heard. Pulmonary/Chest: Effort normal and breath sounds normal. No respiratory distress. He has no wheezes. He has no rales.  Abdominal: Soft. Bowel sounds are normal. He exhibits no distension. There is no tenderness. There is no rebound.  Musculoskeletal: He exhibits no edema or tenderness.  Lymphadenopathy:    He has no cervical adenopathy.  Neurological: He is alert and oriented to person, place, and time. He exhibits normal muscle tone. Coordination normal.  Skin: Skin is warm and dry. He is not diaphoretic. No erythema. No pallor.  Psychiatric: He has a normal mood and affect. His behavior is normal. Judgment and thought content normal.            Assessment & Plan:   #1: HIV: Perfect virological suppression when last checked, check labs again today and continue current regimen 6 months time. I spent greater than 25 minutes with the patient including greater than 50% of time in face to face counsel of the patient and in coordination of their care.    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

## 2015-03-23 LAB — T-HELPER CELL (CD4) - (RCID CLINIC ONLY)
CD4 T CELL HELPER: 22 % — AB (ref 33–55)
CD4 T Cell Abs: 370 /uL — ABNORMAL LOW (ref 400–2700)

## 2015-03-23 LAB — RPR

## 2015-03-26 LAB — HIV RNA, RTPCR W/R GT (RTI, PI,INT)
HIV 1 RNA Quant: 138 copies/mL — ABNORMAL HIGH (ref <20)
HIV-1 RNA Quant, Log: 2.14 Log copies/mL — ABNORMAL HIGH (ref <1.30)

## 2015-04-08 ENCOUNTER — Other Ambulatory Visit: Payer: Self-pay | Admitting: Infectious Disease

## 2015-04-10 ENCOUNTER — Other Ambulatory Visit: Payer: Self-pay | Admitting: *Deleted

## 2015-04-10 DIAGNOSIS — B009 Herpesviral infection, unspecified: Secondary | ICD-10-CM

## 2015-04-10 DIAGNOSIS — B20 Human immunodeficiency virus [HIV] disease: Secondary | ICD-10-CM

## 2015-04-10 MED ORDER — LAMIVUDINE-ZIDOVUDINE 150-300 MG PO TABS: 1.0000 | ORAL_TABLET | Freq: Two times a day (BID) | ORAL | Status: DC

## 2015-04-10 MED ORDER — VALACYCLOVIR HCL 1 G PO TABS: 1000.0000 mg | ORAL_TABLET | Freq: Every day | ORAL | Status: DC

## 2015-04-10 MED ORDER — RALTEGRAVIR POTASSIUM 400 MG PO TABS: 400.0000 mg | ORAL_TABLET | Freq: Two times a day (BID) | ORAL | Status: DC

## 2015-04-10 MED ORDER — ETRAVIRINE 200 MG PO TABS: 1.0000 | ORAL_TABLET | Freq: Two times a day (BID) | ORAL | Status: DC

## 2015-05-28 DIAGNOSIS — J984 Other disorders of lung: Secondary | ICD-10-CM | POA: Insufficient documentation

## 2015-05-31 ENCOUNTER — Other Ambulatory Visit: Payer: Self-pay | Admitting: *Deleted

## 2015-05-31 DIAGNOSIS — B009 Herpesviral infection, unspecified: Secondary | ICD-10-CM

## 2015-05-31 MED ORDER — VALACYCLOVIR HCL 1 G PO TABS: 1000.0000 mg | ORAL_TABLET | Freq: Every day | ORAL | Status: DC

## 2015-06-04 ENCOUNTER — Telehealth: Payer: Self-pay | Admitting: *Deleted

## 2015-06-04 DIAGNOSIS — B009 Herpesviral infection, unspecified: Secondary | ICD-10-CM

## 2015-06-04 MED ORDER — VALACYCLOVIR HCL 1 G PO TABS: 1000.0000 mg | ORAL_TABLET | Freq: Every day | ORAL | Status: DC

## 2015-06-04 NOTE — Telephone Encounter (Signed)
Needing Valtrex rx sent to another pharmacy.

## 2015-09-03 ENCOUNTER — Other Ambulatory Visit: Payer: Self-pay | Admitting: *Deleted

## 2015-09-03 ENCOUNTER — Encounter: Payer: Self-pay | Admitting: Infectious Disease

## 2015-09-03 ENCOUNTER — Ambulatory Visit: Payer: Medicare PPO | Admitting: Infectious Disease

## 2015-09-03 ENCOUNTER — Ambulatory Visit (INDEPENDENT_AMBULATORY_CARE_PROVIDER_SITE_OTHER): Payer: Medicare PPO | Admitting: Infectious Disease

## 2015-09-03 VITALS — BP 116/84 | HR 73 | Temp 97.7°F | Ht 69.0 in | Wt 189.0 lb

## 2015-09-03 DIAGNOSIS — D893 Immune reconstitution syndrome: Secondary | ICD-10-CM

## 2015-09-03 DIAGNOSIS — Z23 Encounter for immunization: Secondary | ICD-10-CM | POA: Diagnosis not present

## 2015-09-03 DIAGNOSIS — I5032 Chronic diastolic (congestive) heart failure: Secondary | ICD-10-CM

## 2015-09-03 DIAGNOSIS — I10 Essential (primary) hypertension: Secondary | ICD-10-CM

## 2015-09-03 DIAGNOSIS — A4902 Methicillin resistant Staphylococcus aureus infection, unspecified site: Secondary | ICD-10-CM

## 2015-09-03 DIAGNOSIS — B009 Herpesviral infection, unspecified: Secondary | ICD-10-CM

## 2015-09-03 DIAGNOSIS — B20 Human immunodeficiency virus [HIV] disease: Secondary | ICD-10-CM | POA: Diagnosis not present

## 2015-09-03 DIAGNOSIS — F528 Other sexual dysfunction not due to a substance or known physiological condition: Secondary | ICD-10-CM

## 2015-09-03 HISTORY — DX: Essential (primary) hypertension: I10

## 2015-09-03 LAB — CBC WITH DIFFERENTIAL/PLATELET
Basophils Absolute: 0 cells/uL (ref 0–200)
Basophils Relative: 0 %
Eosinophils Absolute: 208 cells/uL (ref 15–500)
Eosinophils Relative: 4 %
HCT: 41 % (ref 38.5–50.0)
Hemoglobin: 14 g/dL (ref 13.2–17.1)
LYMPHS ABS: 1768 {cells}/uL (ref 850–3900)
Lymphocytes Relative: 34 %
MCH: 33.3 pg — ABNORMAL HIGH (ref 27.0–33.0)
MCHC: 34.1 g/dL (ref 32.0–36.0)
MCV: 97.4 fL (ref 80.0–100.0)
MPV: 10.3 fL (ref 7.5–12.5)
Monocytes Absolute: 468 cells/uL (ref 200–950)
Monocytes Relative: 9 %
NEUTROS ABS: 2756 {cells}/uL (ref 1500–7800)
Neutrophils Relative %: 53 %
Platelets: 298 10*3/uL (ref 140–400)
RBC: 4.21 MIL/uL (ref 4.20–5.80)
RDW: 16.4 % — ABNORMAL HIGH (ref 11.0–15.0)
WBC: 5.2 10*3/uL (ref 3.8–10.8)

## 2015-09-03 LAB — COMPLETE METABOLIC PANEL WITH GFR
ALBUMIN: 4.1 g/dL (ref 3.6–5.1)
ALT: 18 U/L (ref 9–46)
AST: 21 U/L (ref 10–35)
Alkaline Phosphatase: 71 U/L (ref 40–115)
BILIRUBIN TOTAL: 0.5 mg/dL (ref 0.2–1.2)
BUN: 9 mg/dL (ref 7–25)
CO2: 24 mmol/L (ref 20–31)
Calcium: 9.1 mg/dL (ref 8.6–10.3)
Chloride: 105 mmol/L (ref 98–110)
Creat: 0.79 mg/dL (ref 0.70–1.25)
GFR, Est African American: >89 mL/min (ref >=60)
GFR, Est Non African American: >89 mL/min (ref >=60)
GLUCOSE: 85 mg/dL (ref 65–99)
POTASSIUM: 4.2 mmol/L (ref 3.5–5.3)
SODIUM: 138 mmol/L (ref 135–146)
TOTAL PROTEIN: 7.3 g/dL (ref 6.1–8.1)

## 2015-09-03 LAB — HEPATITIS C ANTIBODY: HCV Ab: NEGATIVE

## 2015-09-03 MED ORDER — TADALAFIL 10 MG PO TABS: 10.0000 mg | ORAL_TABLET | Freq: Every day | ORAL | Status: AC | PRN

## 2015-09-03 NOTE — Progress Notes (Signed)
Chief complaint: followup for HIV   Subjective:    Patient ID: Paul [name], male    DOB: Jul 15, 1955, 60 y.o.   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  His VL is <20 and CD4>200.   [place] himself [place] to [place] but wishes to continue his HIV care here in [place].   Lab Results  Component Value Date   HIV1RNAQUANT 138* 03/22/2015   HIV1RNAQUANT <20 07/24/2014   HIV1RNAQUANT <20 12/26/2013   Lab Results  Component Value Date   CD4TABS 370* 03/22/2015   CD4TABS 260* 07/24/2014   CD4TABS 220* 12/26/2013     Past Medical History  Diagnosis Date  . HIV (human immunodeficiency virus infection) (HCC)   . Diastolic heart failure (HCC)   . Herpes simplex virus (HSV) epithelial keratitis   . IRIS (immune reconstitution inflammatory syndrome) (HCC)   . Hypertension   . Depression     No past surgical history on file.  No family history on file.    Social History   Social History  . Marital Status: Single    Spouse Name: N/A  . Number of Children: N/A  . Years of Education: N/A   Occupational History  . Retail sales    Social History Main Topics  . Smoking status: Former Smoker -- 0.25 packs/day for 20 years    Types: Cigarettes    Quit date: 03/17/1996  . Smokeless tobacco: Never Used  . Alcohol Use: 1.0 oz/week    2 drink(s) per week     Comment: seldom  . Drug Use: No  . Sexual Activity:    Partners: Female     Comment: pt. given condoms   Other Topics Concern  . None   Social History Narrative    Allergies  Allergen Reactions  . Banana Nausea And Vomiting     Current outpatient prescriptions:  .  Etravirine (INTELENCE) 200 MG TABS, Take 1 tablet (200 mg total) by mouth 2 (two) times daily., Disp: 180 tablet, Rfl: 3 .  ferrous sulfate 325 (65 FE) MG  EC tablet, TAKING BID, Disp: 60 tablet, Rfl: 11 .  lamiVUDine-zidovudine (COMBIVIR) 150-300 MG tablet, Take 1 tablet by mouth 2 (two) times daily., Disp: 180 tablet, Rfl: 3 .  raltegravir (ISENTRESS) 400 MG tablet, Take 1 tablet (400 mg total) by mouth 2 (two) times daily., Disp: 180 tablet, Rfl: 3 .  valACYclovir (VALTREX) 1000 MG tablet, Take 1 tablet (1,000 mg total) by mouth daily., Disp: 90 tablet, Rfl: 3 .  furosemide (LASIX) 40 MG tablet, Take 1 tablet (40 mg total) by mouth daily., Disp: 90 tablet, Rfl: 3 .  tadalafil (CIALIS) 10 MG tablet, Take 1 tablet (10 mg total) by mouth daily as needed for erectile dysfunction. (Patient not taking: Reported on 09/03/2015), Disp: 3 tablet, Rfl: 4   Lab Results  Component Value Date   HIV1RNAQUANT 138* 03/22/2015   Lab Results  Component Value Date   CD4TABS 370* 03/22/2015   CD4TABS 260* 07/24/2014   CD4TABS 220* 12/26/2013     Review of Systems  Constitutional: Negative for fever, chills, diaphoresis, activity change, appetite change, fatigue and unexpected weight change.  HENT: Negative for congestion, rhinorrhea, sinus pressure, sneezing, sore throat and trouble swallowing.   Eyes: Negative for photophobia and visual disturbance.  Respiratory: Negative for  cough, chest tightness, wheezing and stridor.   Cardiovascular: Negative for chest pain, palpitations and leg swelling.  Gastrointestinal: Negative for nausea, vomiting, abdominal pain, diarrhea, constipation, blood in stool and abdominal distention.  Genitourinary: Negative for dysuria, hematuria, flank pain and difficulty urinating.  Musculoskeletal: Negative for myalgias, back pain, joint swelling, arthralgias and gait problem.  Skin: Negative for color change, pallor, rash and wound.  Neurological: Negative for dizziness, tremors, weakness and light-headedness.  Hematological: Positive for adenopathy. Does not bruise/bleed easily.  Psychiatric/Behavioral: Negative for behavioral  problems, confusion, sleep disturbance, dysphoric mood, decreased concentration and agitation.       Objective:   Physical Exam  Constitutional: He is oriented to person, place, and time. He appears well-developed and well-nourished. No distress.  HENT:  Head: Normocephalic and atraumatic.  Mouth/Throat: Oropharynx is clear and moist. No oropharyngeal exudate.  Eyes: EOM are normal.  Neck: Normal range of motion. Neck supple. No JVD present.  Cardiovascular: Normal rate, regular rhythm and normal heart sounds.  Exam reveals no gallop and no friction rub.   No murmur heard. Pulmonary/Chest: Effort normal and breath sounds normal. No respiratory distress. He has no wheezes. He has no rales.  Abdominal: Soft. Bowel sounds are normal. He exhibits no distension. There is no tenderness. There is no rebound.  Musculoskeletal: He exhibits no edema or tenderness.  Lymphadenopathy:    He has no cervical adenopathy.  Neurological: He is alert and oriented to person, place, and time. He exhibits normal muscle tone. Coordination normal.  Skin: Skin is warm and dry. He is not diaphoretic. No erythema. No pallor.  Psychiatric: He has a normal mood and affect. His behavior is normal. Judgment and thought content normal.            Assessment & Plan:   #1: HIV: NEAR perfect virological suppression when last checked and he denies having missed any doses.  I wanted to consider simplification of his regimen and told him I would examine his prior genotypes  What I found is that he has COMPLETE R to the NRTI class but to R to Etravirine or to Isentress. This means that he has been managed however with essentially only TWO active drugs (Isentress and Intelence) plus whatever activity we are getting out of AZT/3TC by picking for 184 for example  Below is his "Cumulative genotype" from [employer]:  Drug Resistance Interpretation: PR  PI Major Resistance Mutations:  I54V, V82A  PI Accessory  Resistance Mutations: None Other Mutations: L10I, I15V, D60E, I62V, L63P, A71V, I72MT Protease Inhibitors atazanavir/r (ATV/r) Intermediate Resistance darunavir/r (DRV/r) Susceptible lopinavir/r (LPV/r) Intermediate Resistance   Drug Resistance Interpretation: RT  NRTI Resistance Mutations: M41L, E44A, D67N, M184V, L210W, T215DFVY, K219N NNRTI Resistance Mutations: K103N Other Mutations: V35R, E36D, K49R, K104N, V118I, D123E, I135T, S162C, K173R, G196E, T200A, I202V, H208Y, R211K, V245M, Q278E, R284K, A288S, V292I, I293V, E298A  Nucleoside Reverse Transcriptase Inhibitors  abacavir (ABC) High-Level Resistance zidovudine (AZT) High-Level Resistance emtricitabine (FTC) High-Level Resistance lamivudine (3TC) High-Level Resistance tenofovir (TDF) High-Level Resistance  Non-nucleoside Reverse Transcriptase Inhibitors  efavirenz (EFV) High-Level Resistance etravirine (ETR) Susceptible nevirapine (NVP) High-Level Resistance rilpivirine (RPV) Susceptible  I am checking labs today. Now given his Resistance I think we should strengthen his regimen.  I would consider changing him to Tivicay daily, Edurant daily and Prezcobix to give him 3 fully active drugs rather then the two he has now. I will review with Minh my pharmacist.   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  Filed Vitals:   09/03/15 1014  BP: 116/84  Pulse: 73  Temp: 97.7 F (36.5 C)   I spent greater than 40 minutes with the patient including greater than 50% of time in face to face counsel of the patient re his HIV, his prior IRIS, his HTN and diastolic heart failure  and in coordination of his care.

## 2015-09-04 LAB — HIV-1 RNA QUANT-NO REFLEX-BLD

## 2015-09-04 LAB — T-HELPER CELL (CD4) - (RCID CLINIC ONLY)
CD4 % Helper T Cell: 23 % — ABNORMAL LOW (ref 33–55)
CD4 T Cell Abs: 440 /uL (ref 400–2700)

## 2015-09-04 LAB — RPR

## 2015-09-05 ENCOUNTER — Ambulatory Visit: Payer: Medicare PPO | Admitting: Infectious Disease

## 2015-09-12 ENCOUNTER — Ambulatory Visit: Payer: Medicare PPO | Admitting: Infectious Disease

## 2015-09-27 ENCOUNTER — Telehealth: Payer: Self-pay | Admitting: Pharmacist Clinician (PhC)/ Clinical Pharmacy Specialist

## 2015-09-27 NOTE — Telephone Encounter (Signed)
I have tried to call him at least 3 times to see if I change change the ART to DTG/Prez/RPV but could never get in touch with him. We'd have to do it over the phone since [place] in [place]. Will try again in a few days.

## 2015-09-27 NOTE — Telephone Encounter (Signed)
Paul [name] thanks we may have to change him in person it he is so hard to get ahold of the good news is he has maintained suppression for like 4-5 years on this regimen

## 2016-02-13 ENCOUNTER — Other Ambulatory Visit: Payer: Medicare PPO

## 2016-02-27 ENCOUNTER — Ambulatory Visit: Payer: Medicare PPO | Admitting: Infectious Disease

## 2016-03-06 ENCOUNTER — Other Ambulatory Visit: Payer: Medicare PPO

## 2016-03-26 ENCOUNTER — Ambulatory Visit: Payer: Medicare PPO | Admitting: Infectious Disease

## 2016-04-02 ENCOUNTER — Other Ambulatory Visit: Payer: Medicare PPO

## 2016-04-03 ENCOUNTER — Other Ambulatory Visit: Payer: Medicare PPO

## 2016-04-16 ENCOUNTER — Ambulatory Visit (INDEPENDENT_AMBULATORY_CARE_PROVIDER_SITE_OTHER): Payer: Medicare PPO | Admitting: Infectious Disease

## 2016-04-16 ENCOUNTER — Encounter: Payer: Self-pay | Admitting: Infectious Disease

## 2016-04-16 VITALS — BP 137/88 | HR 86 | Temp 98.8°F | Ht 69.0 in | Wt 184.0 lb

## 2016-04-16 DIAGNOSIS — D893 Immune reconstitution syndrome: Secondary | ICD-10-CM

## 2016-04-16 DIAGNOSIS — Z79899 Other long term (current) drug therapy: Secondary | ICD-10-CM | POA: Diagnosis not present

## 2016-04-16 DIAGNOSIS — Z0000 Encounter for general adult medical examination without abnormal findings: Secondary | ICD-10-CM

## 2016-04-16 DIAGNOSIS — B009 Herpesviral infection, unspecified: Secondary | ICD-10-CM

## 2016-04-16 DIAGNOSIS — J984 Other disorders of lung: Secondary | ICD-10-CM

## 2016-04-16 DIAGNOSIS — B20 Human immunodeficiency virus [HIV] disease: Secondary | ICD-10-CM

## 2016-04-16 DIAGNOSIS — J8410 Pulmonary fibrosis, unspecified: Secondary | ICD-10-CM

## 2016-04-16 DIAGNOSIS — Z113 Encounter for screening for infections with a predominantly sexual mode of transmission: Secondary | ICD-10-CM

## 2016-04-16 LAB — CBC WITH DIFFERENTIAL/PLATELET
BASOS PCT: 1 %
Basophils Absolute: 62 cells/uL (ref 0–200)
EOS PCT: 5 %
Eosinophils Absolute: 310 cells/uL (ref 15–500)
HCT: 42.8 % (ref 38.5–50.0)
Hemoglobin: 14.1 g/dL (ref 13.2–17.1)
LYMPHS ABS: 2046 {cells}/uL (ref 850–3900)
LYMPHS PCT: 33 %
MCH: 31.5 pg (ref 27.0–33.0)
MCHC: 32.9 g/dL (ref 32.0–36.0)
MCV: 95.7 fL (ref 80.0–100.0)
MPV: 10.3 fL (ref 7.5–12.5)
Monocytes Absolute: 558 cells/uL (ref 200–950)
Monocytes Relative: 9 %
NEUTROS PCT: 52 %
Neutro Abs: 3224 cells/uL (ref 1500–7800)
Platelets: 315 10*3/uL (ref 140–400)
RBC: 4.47 MIL/uL (ref 4.20–5.80)
RDW: 16.7 % — ABNORMAL HIGH (ref 11.0–15.0)
WBC: 6.2 10*3/uL (ref 3.8–10.8)

## 2016-04-16 LAB — COMPLETE METABOLIC PANEL WITH GFR
ALBUMIN: 4.2 g/dL (ref 3.6–5.1)
ALK PHOS: 80 U/L (ref 40–115)
ALT: 21 U/L (ref 9–46)
AST: 24 U/L (ref 10–35)
BUN: 11 mg/dL (ref 7–25)
CALCIUM: 9.3 mg/dL (ref 8.6–10.3)
CHLORIDE: 104 mmol/L (ref 98–110)
CO2: 26 mmol/L (ref 20–31)
CREATININE: 0.86 mg/dL (ref 0.70–1.25)
GFR, Est Non African American: >89 mL/min (ref >=60)
Glucose, Bld: 98 mg/dL (ref 65–99)
POTASSIUM: 4.4 mmol/L (ref 3.5–5.3)
Sodium: 139 mmol/L (ref 135–146)
Total Bilirubin: 0.6 mg/dL (ref 0.2–1.2)
Total Protein: 8.1 g/dL (ref 6.1–8.1)

## 2016-04-16 LAB — LIPID PANEL
CHOLESTEROL: 192 mg/dL (ref <200)
HDL: 52 mg/dL (ref >40)
LDL Cholesterol: 119 mg/dL — ABNORMAL HIGH (ref <100)
TRIGLYCERIDES: 107 mg/dL (ref <150)
Total CHOL/HDL Ratio: 3.7 Ratio (ref <5.0)
VLDL: 21 mg/dL (ref <30)

## 2016-04-16 MED ORDER — DOLUTEGRAVIR-RILPIVIRINE 50-25 MG PO TABS: 1.0000 | ORAL_TABLET | Freq: Every day | ORAL | 11 refills | Status: DC

## 2016-04-16 MED ORDER — DARUNAVIR-COBICISTAT 800-150 MG PO TABS
1.0000 | ORAL_TABLET | Freq: Every day | ORAL | 11 refills | Status: DC
Start: 2016-04-16 — End: 2017-04-08

## 2016-04-16 MED FILL — PREZCOBIX 800 MG-150 MG TAB: 800-150 | 30 days supply | Qty: 30 | Fill #0

## 2016-04-16 MED FILL — JULUCA 50-25 MG TAB: 50-25 | 30 days supply | Qty: 30 | Fill #0

## 2016-04-16 NOTE — Progress Notes (Signed)
HPI: Paul [name] is a 61 y.o. male who is here for his f/u for HIV.   Allergies: Allergies  Allergen Reactions  . Banana Nausea And Vomiting    Vitals: Temp: 98.8 F (37.1 C) (01/31 1021) Temp Source: Oral (01/31 1021) BP: 137/88 (01/31 1021) Pulse Rate: 86 (01/31 1021)  Past Medical History: Past Medical History:  Diagnosis Date  . Benign essential HTN 09/03/2015  . Depression   . Diastolic heart failure (HCC)   . Herpes simplex virus (HSV) epithelial keratitis   . HIV (human immunodeficiency virus infection) (HCC)   . Hypertension   . IRIS (immune reconstitution inflammatory syndrome) (HCC)     Social History: Social History   Social History  . Marital status: Single    Spouse name: N/A  . Number of children: N/A  . Years of education: N/A   Occupational History  . Retail sales    Social History Main Topics  . Smoking status: Former Smoker    Packs/day: 0.25    Years: 20.00    Types: Cigarettes    Quit date: 03/17/1996  . Smokeless tobacco: Never Used  . Alcohol use 1.0 oz/week    2 drink(s) per week     Comment: seldom  . Drug use: No  . Sexual activity: Yes    Partners: Female     Comment: pt. given condoms   Other Topics Concern  . None   Social History Narrative  . None    Previous Regimen:   Current Regimen: CBV/RAL/ETR  Labs: HIV 1 RNA Quant (copies/mL)  Date Value  09/03/2015 <20  03/22/2015 138 (H)  07/24/2014 <20   CD4 T Cell Abs (/uL)  Date Value  09/03/2015 440  03/22/2015 370 (L)  07/24/2014 260 (L)   Hep B S Ab (no units)  Date Value  05/11/2006 No   Hepatitis B Surface Ag (no units)  Date Value  05/11/2006 No   HCV Ab (no units)  Date Value  09/03/2015 NEGATIVE    CrCl: CrCl cannot be calculated (Patient's most recent lab result is older than the maximum 21 days allowed.).  Lipids:    Component Value Date/Time   CHOL 172 03/22/2015 1528   TRIG 112 03/22/2015 1528   HDL 42 03/22/2015 1528   CHOLHDL  4.1 03/22/2015 1528   VLDL 22 03/22/2015 1528   LDLCALC 108 03/22/2015 1528   HIV Genotype Composite Data Genotype Dates:   Mutations in Bold impact drug susceptibility RT Mutations M41L, E44A, D67N, M184V, L210W, T215DFVY, K219N, K103N  PI Mutations I54V, V82A  Integrase Mutations    Interpretation of Genotype Data per [employer] HIV Database Nucleoside RTIs  abacavir (ABC) High-Level Resistance zidovudine (AZT) High-Level Resistance emtricitabine (FTC) High-Level Resistance lamivudine (3TC) High-Level Resistance tenofovir (TDF) High-Level Resistance   Non-Nucleoside RTIs  efavirenz (EFV) High-Level Resistance etravirine (ETR) Susceptible nevirapine (NVP) High-Level Resistance rilpivirine (RPV) Susceptible   Protease Inhibitors  atazanavir/r (ATV/r) Intermediate Resistance darunavir/r (DRV/r) Susceptible lopinavir/r (LPV/r) Intermediate Resistance   Integrase Inhibitors      Assessment: Paul [name] is here for his HIV f/u. He still lives in [place], [place] and still prefers to go here. He has a very complicated resistance history. Surprisingly, he is suppressed on this current regimen even though NRTI is fully resistance. I tried to call him in July to bring him in to change the regimen but he couldn't do it due to living in [place]. At today's visit, we discuss the change with him to a fully  active regimen with the new Jaluca + Prescobix. Since Paul [name] has just released, we feel that his medicare insurance may not cover it yet. However, [facility] has a program where they will provide meds for 3 months. We'll send it to [facility] to see if it can be filled there and mail them to him.   Recommendations:  Change ART to [place] + Prezcobix [facility] F/u in 6 wks for VL check up  Paul SouthwardMinh Aikam [name], PharmD, BCPS, AAHIVP, CPP Clinical Infectious Disease Pharmacist [facility] 04/16/2016, 10:56 AM

## 2016-04-16 NOTE — Progress Notes (Signed)
Chief complaint: followup for HIV   Subjective:    Patient ID: Paul [name], male    DOB: 1955/04/21, 61 y.o.   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  His VL is <20 and CD4>200.   Pt himself has moved to [place] but wishes to continue his HIV care here in [place].   Lab Results  Component Value Date   HIV1RNAQUANT <20 09/03/2015   HIV1RNAQUANT 138 (H) 03/22/2015   HIV1RNAQUANT <20 07/24/2014   Lab Results  Component Value Date   CD4TABS 440 09/03/2015   CD4TABS 370 (L) 03/22/2015   CD4TABS 260 (L) 07/24/2014     Past Medical History:  Diagnosis Date  . Benign essential HTN 09/03/2015  . Depression   . Diastolic heart failure (HCC)   . Herpes simplex virus (HSV) epithelial keratitis   . HIV (human immunodeficiency virus infection) (HCC)   . Hypertension   . IRIS (immune reconstitution inflammatory syndrome) (HCC)     No past surgical history on file.  No family history on file.    Social History   Social History  . Marital status: Single    Spouse name: N/A  . Number of children: N/A  . Years of education: N/A   Occupational History  . Retail sales    Social History Main Topics  . Smoking status: Former Smoker    Packs/day: 0.25    Years: 20.00    Types: Cigarettes    Quit date: 03/17/1996  . Smokeless tobacco: Never Used  . Alcohol use 1.0 oz/week    2 drink(s) per week     Comment: seldom  . Drug use: No  . Sexual activity: Yes    Partners: Female     Comment: pt. given condoms   Other Topics Concern  . None   Social History Narrative  . None    Allergies  Allergen Reactions  . Banana Nausea And Vomiting     Current Outpatient Prescriptions:  .  Etravirine (INTELENCE) 200 MG TABS, Take 1 tablet (200 mg total) by mouth 2 (two) times daily.,  Disp: 180 tablet, Rfl: 3 .  ferrous sulfate 325 (65 FE) MG EC tablet, TAKING BID, Disp: 60 tablet, Rfl: 11 .  lamiVUDine-zidovudine (COMBIVIR) 150-300 MG tablet, Take 1 tablet by mouth 2 (two) times daily., Disp: 180 tablet, Rfl: 3 .  raltegravir (ISENTRESS) 400 MG tablet, Take 1 tablet (400 mg total) by mouth 2 (two) times daily., Disp: 180 tablet, Rfl: 3 .  tadalafil (CIALIS) 10 MG tablet, Take 1 tablet (10 mg total) by mouth daily as needed for erectile dysfunction., Disp: 7 tablet, Rfl: 4 .  valACYclovir (VALTREX) 1000 MG tablet, Take 1 tablet (1,000 mg total) by mouth daily., Disp: 90 tablet, Rfl: 3 .  furosemide (LASIX) 40 MG tablet, Take 1 tablet (40 mg total) by mouth daily., Disp: 90 tablet, Rfl: 3   Lab Results  Component Value Date   HIV1RNAQUANT <20 09/03/2015   Lab Results  Component Value Date   CD4TABS 440 09/03/2015   CD4TABS 370 (L) 03/22/2015   CD4TABS 260 (L) 07/24/2014     Review of Systems  Constitutional: Negative for activity change, appetite change, chills, diaphoresis, fatigue, fever and unexpected weight change.  HENT: Negative for congestion, rhinorrhea, sinus pressure, sneezing, sore throat and trouble swallowing.  Eyes: Negative for photophobia and visual disturbance.  Respiratory: Negative for cough, chest tightness, wheezing and stridor.   Cardiovascular: Negative for chest pain, palpitations and leg swelling.  Gastrointestinal: Negative for abdominal distention, abdominal pain, blood in stool, constipation, diarrhea, nausea and vomiting.  Genitourinary: Negative for difficulty urinating, dysuria, flank pain and hematuria.  Musculoskeletal: Negative for arthralgias, back pain, gait problem, joint swelling and myalgias.  Skin: Negative for color change, pallor, rash and wound.  Neurological: Negative for dizziness, tremors, weakness and light-headedness.  Hematological: Positive for adenopathy. Does not bruise/bleed easily.  Psychiatric/Behavioral:  Negative for agitation, behavioral problems, confusion, decreased concentration, dysphoric mood and sleep disturbance.       Objective:   Physical Exam  Constitutional: He is oriented to person, place, and time. He appears well-developed and well-nourished. No distress.  HENT:  Head: Normocephalic and atraumatic.  Mouth/Throat: Oropharynx is clear and moist. No oropharyngeal exudate.  Eyes: EOM are normal.  Neck: Normal range of motion. Neck supple. No JVD present.  Cardiovascular: Normal rate, regular rhythm and normal heart sounds.  Exam reveals no gallop and no friction rub.   No murmur heard. Pulmonary/Chest: Effort normal and breath sounds normal. No respiratory distress. He has no wheezes. He has no rales.  Abdominal: Soft. Bowel sounds are normal. He exhibits no distension. There is no tenderness. There is no rebound.  Musculoskeletal: He exhibits no edema or tenderness.  Lymphadenopathy:    He has no cervical adenopathy.  Neurological: He is alert and oriented to person, place, and time. He exhibits normal muscle tone. Coordination normal.  Skin: Skin is warm and dry. He is not diaphoretic. No erythema. No pallor.  Psychiatric: He has a normal mood and affect. His behavior is normal. Judgment and thought content normal.            Assessment & Plan:   #1: HIV: NEAR perfect virological suppression when last checked and he denies having missed any doses.  After his last visit I had wanted to consider simplification of his regimen and told him I would examine his prior genotypes  What I found is that he has COMPLETE R to the NRTI class but to R to Etravirine or to Isentress. This means that he has been managed however with essentially only TWO active drugs (Isentress and Intelence) plus whatever activity we are getting out of AZT/3TC by picking for 184 for example  Below is his "Cumulative genotype" from [employer]:  Drug Resistance Interpretation: PR  PI Major Resistance  Mutations:  I54V, V82A  PI Accessory Resistance Mutations: None Other Mutations: L10I, I15V, D60E, I62V, L63P, A71V, I72MT Protease Inhibitors atazanavir/r (ATV/r) Intermediate Resistance darunavir/r (DRV/r) Susceptible lopinavir/r (LPV/r) Intermediate Resistance   Drug Resistance Interpretation: RT  NRTI Resistance Mutations: M41L, E44A, D67N, M184V, L210W, T215DFVY, K219N NNRTI Resistance Mutations: K103N Other Mutations: V35R, E36D, K49R, K104N, V118I, D123E, I135T, S162C, K173R, G196E, T200A, I202V, H208Y, R211K, V245M, Q278E, R284K, A288S, V292I, I293V, E298A  Nucleoside Reverse Transcriptase Inhibitors  abacavir (ABC) High-Level Resistance zidovudine (AZT) High-Level Resistance emtricitabine (FTC) High-Level Resistance lamivudine (3TC) High-Level Resistance tenofovir (TDF) High-Level Resistance  Non-nucleoside Reverse Transcriptase Inhibitors  efavirenz (EFV) High-Level Resistance etravirine (ETR) Susceptible nevirapine (NVP) High-Level Resistance rilpivirine (RPV) Susceptible  Check labs then he had a low viral load of 138 copies.  We tried to get him in to change him over to [place] daily, Edurant daily and Prezcobix to give him 3 fully active drugs rather then the two he has now, that when  we called him over the phone it was difficult to get in touch with him again and back into care. Today he agrees to be placed on this much more potent regimen with a higher barrier to resistance.  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  Vitals:   04/16/16 1021  BP: 137/88  Pulse: 86  Temp: 98.8 F (37.1 C)   I spent greater than 40 minutes with the patient including greater than  50% of time in face to face counsel of the patient re his HIV, His new HIV regimen his prior IRIS, his HTN and diastolic heart failure and in coordination of his care.

## 2016-04-17 LAB — T-HELPER CELL (CD4) - (RCID CLINIC ONLY)
CD4 % Helper T Cell: 22 % — ABNORMAL LOW (ref 33–55)
CD4 T Cell Abs: 470 /uL (ref 400–2700)

## 2016-04-17 LAB — RPR

## 2016-04-21 LAB — HIV RNA, RTPCR W/R GT (RTI, PI,INT)
HIV-1 RNA, QN PCR: 1.72 {Log_copies}/mL — AB
HIV-1 RNA, QN PCR: 52 {copies}/mL — AB

## 2016-04-24 ENCOUNTER — Other Ambulatory Visit: Payer: Self-pay | Admitting: Infectious Disease

## 2016-04-24 DIAGNOSIS — B20 Human immunodeficiency virus [HIV] disease: Secondary | ICD-10-CM

## 2016-05-20 ENCOUNTER — Telehealth: Payer: Self-pay | Admitting: *Deleted

## 2016-05-20 MED FILL — JULUCA 50-25 MG TAB: 50-25 | 30 days supply | Qty: 30 | Fill #1

## 2016-05-20 MED FILL — PREZCOBIX 800 MG-150 MG TAB: 800-150 | 30 days supply | Qty: 30 | Fill #1

## 2016-05-20 NOTE — Telephone Encounter (Signed)
Thanks Betty 

## 2016-05-20 NOTE — Telephone Encounter (Signed)
Patient called about his Rx for Juluca and Prezcobix. [facility] [place]. Spoke to GastonBetty, Associate Professorpharmacy tech and she will call to to arrange shipment. Patient is almost out of these medications.

## 2016-05-20 NOTE — Telephone Encounter (Signed)
I reached out and meds will be mailed.No meds will be missed

## 2016-06-16 MED FILL — PREZCOBIX 800 MG-150 MG TAB: 800-150 | 30 days supply | Qty: 30 | Fill #2

## 2016-06-16 MED FILL — JULUCA 50-25 MG TAB: 50-25 | 30 days supply | Qty: 30 | Fill #2

## 2016-06-25 ENCOUNTER — Other Ambulatory Visit: Payer: Self-pay | Admitting: Infectious Disease

## 2016-06-25 DIAGNOSIS — B009 Herpesviral infection, unspecified: Secondary | ICD-10-CM

## 2016-06-30 ENCOUNTER — Ambulatory Visit: Payer: Medicare PPO | Admitting: Infectious Disease

## 2016-07-15 MED FILL — PREZCOBIX 800 MG-150 MG TAB: 800-150 | 30 days supply | Qty: 30 | Fill #3

## 2016-07-15 MED FILL — JULUCA 50-25 MG TAB: 50-25 | 30 days supply | Qty: 30 | Fill #3

## 2016-08-06 ENCOUNTER — Encounter: Payer: Self-pay | Admitting: Infectious Disease

## 2016-08-06 ENCOUNTER — Ambulatory Visit (INDEPENDENT_AMBULATORY_CARE_PROVIDER_SITE_OTHER): Payer: Medicare PPO | Admitting: Infectious Disease

## 2016-08-06 VITALS — BP 129/81 | HR 78 | Temp 98.4°F | Ht 69.0 in | Wt 185.0 lb

## 2016-08-06 DIAGNOSIS — Z79899 Other long term (current) drug therapy: Secondary | ICD-10-CM | POA: Diagnosis not present

## 2016-08-06 DIAGNOSIS — I10 Essential (primary) hypertension: Secondary | ICD-10-CM | POA: Diagnosis not present

## 2016-08-06 DIAGNOSIS — Z113 Encounter for screening for infections with a predominantly sexual mode of transmission: Secondary | ICD-10-CM

## 2016-08-06 DIAGNOSIS — B20 Human immunodeficiency virus [HIV] disease: Secondary | ICD-10-CM

## 2016-08-06 DIAGNOSIS — D893 Immune reconstitution syndrome: Secondary | ICD-10-CM

## 2016-08-06 DIAGNOSIS — J8410 Pulmonary fibrosis, unspecified: Secondary | ICD-10-CM

## 2016-08-06 DIAGNOSIS — B009 Herpesviral infection, unspecified: Secondary | ICD-10-CM | POA: Diagnosis not present

## 2016-08-06 LAB — COMPLETE METABOLIC PANEL WITH GFR
ALT: 12 U/L (ref 9–46)
AST: 16 U/L (ref 10–35)
Albumin: 4.3 g/dL (ref 3.6–5.1)
Alkaline Phosphatase: 71 U/L (ref 40–115)
BUN: 10 mg/dL (ref 7–25)
CALCIUM: 9.1 mg/dL (ref 8.6–10.3)
CHLORIDE: 103 mmol/L (ref 98–110)
CO2: 25 mmol/L (ref 20–31)
CREATININE: 1.09 mg/dL (ref 0.70–1.25)
GFR, EST AFRICAN AMERICAN: 84 mL/min (ref >=60)
GFR, EST NON AFRICAN AMERICAN: 73 mL/min (ref >=60)
Glucose, Bld: 100 mg/dL — ABNORMAL HIGH (ref 65–99)
POTASSIUM: 4 mmol/L (ref 3.5–5.3)
Sodium: 137 mmol/L (ref 135–146)
Total Bilirubin: 0.4 mg/dL (ref 0.2–1.2)
Total Protein: 8.1 g/dL (ref 6.1–8.1)

## 2016-08-06 LAB — CBC WITH DIFFERENTIAL/PLATELET
BASOS PCT: 1 %
Basophils Absolute: 60 cells/uL (ref 0–200)
EOS ABS: 360 {cells}/uL (ref 15–500)
Eosinophils Relative: 6 %
HEMATOCRIT: 31.6 % — AB (ref 38.5–50.0)
Hemoglobin: 9.3 g/dL — ABNORMAL LOW (ref 13.2–17.1)
LYMPHS PCT: 28 %
Lymphs Abs: 1680 cells/uL (ref 850–3900)
MCH: 20.6 pg — ABNORMAL LOW (ref 27.0–33.0)
MCHC: 29.4 g/dL — ABNORMAL LOW (ref 32.0–36.0)
MCV: 70.1 fL — AB (ref 80.0–100.0)
MONO ABS: 540 {cells}/uL (ref 200–950)
MPV: 8.8 fL (ref 7.5–12.5)
Monocytes Relative: 9 %
Neutro Abs: 3360 cells/uL (ref 1500–7800)
Neutrophils Relative %: 56 %
Platelets: 395 10*3/uL (ref 140–400)
RBC: 4.51 MIL/uL (ref 4.20–5.80)
RDW: 19.1 % — AB (ref 11.0–15.0)
WBC: 6 10*3/uL (ref 3.8–10.8)

## 2016-08-06 LAB — LIPID PANEL
CHOLESTEROL: 167 mg/dL (ref <200)
HDL: 55 mg/dL (ref >40)
LDL Cholesterol: 92 mg/dL (ref <100)
Total CHOL/HDL Ratio: 3 Ratio (ref <5.0)
Triglycerides: 98 mg/dL (ref <150)
VLDL: 20 mg/dL (ref <30)

## 2016-08-06 NOTE — Addendum Note (Signed)
Addended by: Mariea ClontsGREEN, Aleaya Latona D on: 08/06/2016 12:12 PM   Modules accepted: Orders

## 2016-08-06 NOTE — Progress Notes (Signed)
Chief complaint: followup for HIV   Subjective:    Patient ID: Paul [name], male    DOB: Feb 10, 1956, 61 y.o.   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   He [place] travel from the coast [facility]([place]) to come see us so I have him do same day labs   He and his partner likely moving to [place] next year though he will carefully plan  Lab Results  Component Value Date   HIV1RNAQUANT <20 09/03/2015   HIV1RNAQUANT 138 (H) 03/22/2015   HIV1RNAQUANT <20 07/24/2014   Lab Results  Component Value Date   CD4TABS 470 04/16/2016   CD4TABS 440 09/03/2015   CD4TABS 370 (L) 03/22/2015     Past Medical History:  Diagnosis Date  . Benign essential HTN 09/03/2015  . Depression   . Diastolic heart failure (HCC)   . Herpes simplex virus (HSV) epithelial keratitis   . HIV (human immunodeficiency virus infection) (HCC)   . Hypertension   . IRIS (immune reconstitution inflammatory syndrome) (HCC)     No past surgical history on file.  No family history on file.    Social History   Social History  . Marital status: Single    Spouse name: N/A  . Number of children: N/A  . Years of education: N/A   Occupational History  . Retail sales    Social History Main Topics  . Smoking status: Former Smoker    Packs/day: 0.25    Years: 20.00    Types: Cigarettes    Quit date: 03/17/1996  . Smokeless tobacco: Never Used  . Alcohol use 1.0 oz/week    2 drink(s) per week     Comment: seldom  . Drug use: No  . Sexual activity: Yes    Partners: Female     Comment: pt. given condoms   Other Topics Concern  . Not on file   Social History Narrative  . No narrative on file    Allergies  Allergen  Reactions  . Banana Nausea And Vomiting     Current Outpatient Prescriptions:  .  darunavir-cobicistat (PREZCOBIX) 800-150 MG tablet, Take 1 tablet by mouth daily., Disp: 30 tablet, Rfl: 11 .  Dolutegravir-Rilpivirine (JULUCA) 50-25 MG TABS, Take 1 tablet by mouth daily with breakfast., Disp: 30 tablet, Rfl: 11 .  ferrous sulfate 325 (65 FE) MG EC tablet, TAKING BID, Disp: 60 tablet, Rfl: 11 .  furosemide (LASIX) 40 MG tablet, Take 1 tablet (40 mg total) by mouth daily., Disp: 90 tablet, Rfl: 3 .  INTELENCE 200 MG TABS, TAKE 1 TABLET BY MOUTH TWICE DAILY, Disp: 180 tablet, Rfl: 1 .  tadalafil (CIALIS) 10 MG tablet, Take 1 tablet (10 mg total) by mouth daily as needed for erectile dysfunction., Disp: 7 tablet, Rfl: 4 .  valACYclovir (VALTREX) 1000 MG tablet, TAKE 1 TABLET (1,000 MG TOTAL) BY MOUTH DAILY., Disp: 90 tablet, Rfl: 3   Lab Results  Component Value Date   HIV1RNAQUANT <20 09/03/2015   Lab Results  Component Value Date   CD4TABS 470 04/16/2016   CD4TABS 440 09/03/2015   CD4TABS 370 (L) 03/22/2015     Review of Systems  Constitutional: Negative for activity change, appetite change, chills,  diaphoresis, fatigue, fever and unexpected weight change.  HENT: Negative for congestion, rhinorrhea, sinus pressure, sneezing, sore throat and trouble swallowing.   Respiratory: Negative for cough, chest tightness, wheezing and stridor.   Cardiovascular: Negative for chest pain, palpitations and leg swelling.  Gastrointestinal: Negative for abdominal distention, abdominal pain, blood in stool, constipation, diarrhea, nausea and vomiting.  Genitourinary: Negative for hematuria.  Musculoskeletal: Negative for arthralgias, back pain, gait problem, joint swelling and myalgias.  Skin: Negative for color change, pallor, rash and wound.  Neurological: Negative for dizziness, tremors, weakness and light-headedness.  Hematological: Positive for adenopathy. Does not bruise/bleed easily.   Psychiatric/Behavioral: Negative for agitation, behavioral problems, confusion, decreased concentration, dysphoric mood and sleep disturbance.       Objective:   Physical Exam  Constitutional: He is oriented to person, place, and time. He appears well-developed and well-nourished. No distress.  HENT:  Head: Normocephalic and atraumatic.  Mouth/Throat: Oropharynx is clear and moist. No oropharyngeal exudate.  Eyes: EOM are normal.  Neck: Normal range of motion. Neck supple. No JVD present.  Cardiovascular: Normal rate, regular rhythm and normal heart sounds.   Pulmonary/Chest: Effort normal. No respiratory distress. He has no wheezes. He exhibits no tenderness.  Abdominal: Soft. Bowel sounds are normal. He exhibits no distension.  Musculoskeletal: He exhibits no edema or tenderness.  Lymphadenopathy:    He has no cervical adenopathy.  Neurological: He is alert and oriented to person, place, and time. He exhibits normal muscle tone. Coordination normal.  Skin: Skin is warm and dry. He is not diaphoretic. No erythema. No pallor.  Psychiatric: He has a normal mood and affect. His behavior is normal. Judgment and thought content normal.            Assessment & Plan:   #1: HIV: NEAR perfect virological suppression when last checked and he denies having missed any doses.   Below is his "Cumulative genotype" from [employer]:  Drug Resistance Interpretation: PR  PI Major Resistance Mutations:  I54V, V82A  PI Accessory Resistance Mutations: None Other Mutations: L10I, I15V, D60E, I62V, L63P, A71V, I72MT Protease Inhibitors atazanavir/r (ATV/r) Intermediate Resistance darunavir/r (DRV/r) Susceptible lopinavir/r (LPV/r) Intermediate Resistance   Drug Resistance Interpretation: RT  NRTI Resistance Mutations: M41L, E44A, D67N, M184V, L210W, T215DFVY, K219N NNRTI Resistance Mutations: K103N Other Mutations: V35R, E36D, K49R, K104N, V118I, D123E, I135T, S162C, K173R, G196E,  T200A, I202V, H208Y, R211K, V245M, Q278E, R284K, A288S, V292I, I293V, E298A  Nucleoside Reverse Transcriptase Inhibitors  abacavir (ABC) High-Level Resistance zidovudine (AZT) High-Level Resistance emtricitabine (FTC) High-Level Resistance lamivudine (3TC) High-Level Resistance tenofovir (TDF) High-Level Resistance  Non-nucleoside Reverse Transcriptase Inhibitors  efavirenz (EFV) High-Level Resistance etravirine (ETR) Susceptible nevirapine (NVP) High-Level Resistance rilpivirine (RPV) Susceptible  NOW ON JULUCA and PREZCOBIX  Check labs today and if fine then RTC in 6 months time for visit and labs     #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  There were no vitals filed for this visit.

## 2016-08-07 LAB — T-HELPER CELL (CD4) - (RCID CLINIC ONLY)
CD4 T CELL HELPER: 23 % — AB (ref 33–55)
CD4 T Cell Abs: 411 /uL (ref 400–2700)

## 2016-08-07 LAB — RPR

## 2016-08-09 LAB — HIV-1 RNA QUANT-NO REFLEX-BLD
HIV 1 RNA Quant: <20 copies/mL
HIV-1 RNA Quant, Log: <1.3 Log copies/mL

## 2016-08-13 MED FILL — PREZCOBIX 800 MG-150 MG TAB: 800-150 | 30 days supply | Qty: 30 | Fill #4

## 2016-08-13 MED FILL — JULUCA 50-25 MG TAB: 50-25 | 30 days supply | Qty: 30 | Fill #4

## 2016-09-05 MED FILL — JULUCA 50-25 MG TAB: 50-25 | 30 days supply | Qty: 30 | Fill #5

## 2016-09-05 MED FILL — PREZCOBIX 800 MG-150 MG TAB: 800-150 | 30 days supply | Qty: 30 | Fill #5

## 2016-10-10 MED FILL — JULUCA 50-25 MG TAB: 50-25 | 30 days supply | Qty: 30 | Fill #6

## 2016-10-10 MED FILL — PREZCOBIX 800 MG-150 MG TAB: 800-150 | 30 days supply | Qty: 30 | Fill #6

## 2016-11-04 MED FILL — PREZCOBIX 800 MG-150 MG TAB: 800-150 | 30 days supply | Qty: 30 | Fill #7

## 2016-11-04 MED FILL — JULUCA 50-25 MG TAB: 50-25 | 30 days supply | Qty: 30 | Fill #7

## 2016-12-09 MED FILL — PREZCOBIX 800 MG-150 MG TAB: 800-150 | 30 days supply | Qty: 30 | Fill #8

## 2016-12-09 MED FILL — JULUCA 50-25 MG TAB: 50-25 | 30 days supply | Qty: 30 | Fill #8

## 2017-01-01 MED FILL — PREZCOBIX 800 MG-150 MG TAB: 800-150 | 30 days supply | Qty: 30 | Fill #9

## 2017-01-01 MED FILL — JULUCA 50-25 MG TAB: 50-25 | 30 days supply | Qty: 30 | Fill #9

## 2017-01-19 ENCOUNTER — Other Ambulatory Visit: Payer: Self-pay | Admitting: Pharmacist

## 2017-01-27 MED FILL — JULUCA 50-25 MG TAB: 50-25 | 30 days supply | Qty: 30 | Fill #10

## 2017-01-27 MED FILL — PREZCOBIX 800 MG-150 MG TAB: 800-150 | 30 days supply | Qty: 30 | Fill #10

## 2017-02-09 ENCOUNTER — Ambulatory Visit: Payer: Medicare PPO | Admitting: Infectious Disease

## 2017-02-23 ENCOUNTER — Ambulatory Visit: Payer: Medicare PPO | Admitting: Infectious Disease

## 2017-03-12 MED FILL — PREZCOBIX 800 MG-150 MG TAB: 800-150 | 30 days supply | Qty: 30 | Fill #11

## 2017-03-12 MED FILL — JULUCA 50-25 MG TAB: 50-25 | 30 days supply | Qty: 30 | Fill #11

## 2017-04-08 ENCOUNTER — Other Ambulatory Visit: Payer: Self-pay | Admitting: Infectious Disease

## 2017-04-08 DIAGNOSIS — B20 Human immunodeficiency virus [HIV] disease: Secondary | ICD-10-CM

## 2017-04-10 MED FILL — PREZCOBIX 800 MG-150 MG TAB: 800-150 | 30 days supply | Qty: 30 | Fill #0

## 2017-04-10 MED FILL — JULUCA 50-25 MG TAB: 50-25 | 30 days supply | Qty: 30 | Fill #0

## 2017-04-13 ENCOUNTER — Encounter: Payer: Self-pay | Admitting: Infectious Disease

## 2017-04-13 ENCOUNTER — Other Ambulatory Visit: Payer: Self-pay | Admitting: Infectious Disease

## 2017-04-13 ENCOUNTER — Ambulatory Visit: Payer: Medicare PPO | Admitting: Infectious Disease

## 2017-04-13 VITALS — BP 119/77 | HR 66 | Temp 97.7°F | Ht 69.0 in | Wt 193.0 lb

## 2017-04-13 DIAGNOSIS — I5032 Chronic diastolic (congestive) heart failure: Secondary | ICD-10-CM

## 2017-04-13 DIAGNOSIS — Z113 Encounter for screening for infections with a predominantly sexual mode of transmission: Secondary | ICD-10-CM | POA: Diagnosis not present

## 2017-04-13 DIAGNOSIS — B20 Human immunodeficiency virus [HIV] disease: Secondary | ICD-10-CM | POA: Diagnosis not present

## 2017-04-13 DIAGNOSIS — Z79899 Other long term (current) drug therapy: Secondary | ICD-10-CM

## 2017-04-13 DIAGNOSIS — I10 Essential (primary) hypertension: Secondary | ICD-10-CM

## 2017-04-13 DIAGNOSIS — D893 Immune reconstitution syndrome: Secondary | ICD-10-CM

## 2017-04-13 NOTE — Progress Notes (Signed)
Chief complaint: followup for HIV   Subjective:    Patient ID: Paul [name], male    DOB: 31-Mar-1955, 62 y.o.   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  Lab Results  Component Value Date   HIV1RNAQUANT <20 NOT DETECTED 08/06/2016   HIV1RNAQUANT <20 09/03/2015   HIV1RNAQUANT 138 (H) 03/22/2015   Lab Results  Component Value Date   CD4TABS 411 08/06/2016   CD4TABS 470 04/16/2016   CD4TABS 440 09/03/2015     Past Medical History:  Diagnosis Date  . Benign essential HTN 09/03/2015  . Depression   . Diastolic heart failure (HCC)   . Herpes simplex virus (HSV) epithelial keratitis   . HIV (human immunodeficiency virus infection) (HCC)   . Hypertension   . IRIS (immune reconstitution inflammatory syndrome) (HCC)     No past surgical history on file.  No family history on file.    Social History   Socioeconomic History  . Marital status: Single    Spouse name: None  . Number of children: None  . Years of education: None  . Highest education level: None  Social Needs  . Financial resource strain: None  . Food insecurity - worry: None  . Food insecurity - inability: None  . Transportation needs - medical: None  . Transportation needs - non-medical: None  Occupational History  . Occupation: Physicist, medicaletail sales  Tobacco Use  . Smoking status: Former Smoker    Packs/day: 0.25    Years: 20.00    Pack years: 5.00    Types: Cigarettes    Last attempt to quit: 03/17/1996    Years since quitting: 21.0  . Smokeless tobacco: Never Used  Substance and  Sexual Activity  . Alcohol use: Yes    Alcohol/week: 1.0 oz    Types: 2 Standard drinks or equivalent per week    Comment: seldom  . Drug use: No  . Sexual activity: Yes    Partners: Female    Comment: pt. declined condoms  Other Topics Concern  . None  Social History Narrative  . None    Allergies  Allergen Reactions  . Banana Nausea And Vomiting     Current Outpatient Medications:  .  ferrous sulfate 325 (65 FE) MG EC tablet, TAKING BID (Patient taking differently: Take 325 mg by mouth daily with breakfast. TAKING BID), Disp: 60 tablet, Rfl: 11 .  furosemide (LASIX) 40 MG tablet, TAKE 1 TABLET(40 MG) BY MOUTH DAILY, Disp: , Rfl:  .  hydrocortisone (ANUSOL-HC) 2.5 % rectal cream, Place rectally 2 (two) times daily., Disp: , Rfl:  .  JULUCA 50-25 MG TABS, TAKE 1 TABLET BY MOUTH DAILY WITH BREAKFAST., Disp: 30 tablet, Rfl: 11 .  nitroGLYCERIN (NITROSTAT) 0.4 MG SL tablet, Place 1 tablet (0.4 mg total) under the tongue every 5 (five) minutes as needed for Chest pain., Disp: , Rfl:  .  PREZCOBIX 800-150 MG tablet, TAKE 1 TABLET BY MOUTH ONCE DAILY, Disp: 30 tablet, Rfl: 11 .  tadalafil (CIALIS) 10 MG tablet, Take 1  tablet (10 mg total) by mouth daily as needed for erectile dysfunction., Disp: 7 tablet, Rfl: 4 .  valACYclovir (VALTREX) 1000 MG tablet, TAKE 1 TABLET (1,000 MG TOTAL) BY MOUTH DAILY., Disp: 90 tablet, Rfl: 3   Lab Results  Component Value Date   HIV1RNAQUANT <20 NOT DETECTED 08/06/2016   Lab Results  Component Value Date   CD4TABS 411 08/06/2016   CD4TABS 470 04/16/2016   CD4TABS 440 09/03/2015     Review of Systems  Constitutional: Negative for activity change, appetite change, chills, diaphoresis, fatigue, fever and unexpected weight change.  HENT: Negative for congestion, rhinorrhea, sinus pressure, sneezing, sore throat and trouble swallowing.   Respiratory: Negative for cough, chest tightness, wheezing and stridor.   Cardiovascular: Negative for chest  pain, palpitations and leg swelling.  Gastrointestinal: Negative for abdominal distention, abdominal pain, blood in stool, constipation, diarrhea, nausea and vomiting.  Genitourinary: Negative for hematuria.  Musculoskeletal: Negative for arthralgias, back pain, gait problem, joint swelling and myalgias.  Skin: Negative for color change, pallor, rash and wound.  Neurological: Negative for dizziness, tremors, weakness and light-headedness.  Hematological: Does not bruise/bleed easily.  Psychiatric/Behavioral: Negative for agitation, behavioral problems, confusion, decreased concentration, dysphoric mood and sleep disturbance.       Objective:   Physical Exam  Constitutional: He is oriented to person, place, and time. He appears well-developed and well-nourished. No distress.  HENT:  Head: Normocephalic and atraumatic.  Mouth/Throat: Oropharynx is clear and moist. No oropharyngeal exudate.  Eyes: EOM are normal.  Neck: Normal range of motion. Neck supple. No JVD present.  Cardiovascular: Normal rate, regular rhythm and normal heart sounds.  Pulmonary/Chest: Effort normal. No respiratory distress. He has no wheezes.  Abdominal: Soft. Bowel sounds are normal. He exhibits no distension.  Musculoskeletal: He exhibits no edema or tenderness.  Lymphadenopathy:    He has no cervical adenopathy.  Neurological: He is alert and oriented to person, place, and time. He exhibits normal muscle tone. Coordination normal.  Skin: Skin is warm and dry. He is not diaphoretic. No erythema. No pallor.  Psychiatric: He has a normal mood and affect. His behavior is normal. Judgment and thought content normal.  Nursing note and vitals reviewed.           Assessment & Plan:   #1: HIV: check labs today and continue PREZCOBIX and JULUCA with chewable meal and no antacids  Below is his "Cumulative genotype" from [employer]:  Drug Resistance Interpretation: PR  PI Major Resistance Mutations:  I54V,  V82A  PI Accessory Resistance Mutations: None Other Mutations: L10I, I15V, D60E, I62V, L63P, A71V, I72MT Protease Inhibitors atazanavir/r (ATV/r) Intermediate Resistance darunavir/r (DRV/r) Susceptible lopinavir/r (LPV/r) Intermediate Resistance   Drug Resistance Interpretation: RT  NRTI Resistance Mutations: M41L, E44A, D67N, M184V, L210W, T215DFVY, K219N NNRTI Resistance Mutations: K103N Other Mutations: V35R, E36D, K49R, K104N, V118I, D123E, I135T, S162C, K173R, G196E, T200A, I202V, H208Y, R211K, V245M, Q278E, R284K, A288S, V292I, I293V, E298A  Nucleoside Reverse Transcriptase Inhibitors  abacavir (ABC) High-Level Resistance zidovudine (AZT) High-Level Resistance emtricitabine (FTC) High-Level Resistance lamivudine (3TC) High-Level Resistance tenofovir (TDF) High-Level Resistance  Non-nucleoside Reverse Transcriptase Inhibitors  efavirenz (EFV) High-Level Resistance etravirine (ETR) Susceptible nevirapine (NVP) High-Level Resistance rilpivirine (RPV) Susceptible   #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  Vitals:   04/13/17  1025  BP: 119/77  Pulse: 66  Temp: 97.7 F (36.5 C)

## 2017-04-14 LAB — COMPLETE METABOLIC PANEL WITH GFR
AG RATIO: 1.2 (calc) (ref 1.0–2.5)
ALBUMIN MSPROF: 4.1 g/dL (ref 3.6–5.1)
ALT: 15 U/L (ref 9–46)
AST: 18 U/L (ref 10–35)
Alkaline phosphatase (APISO): 63 U/L (ref 40–115)
BILIRUBIN TOTAL: 0.4 mg/dL (ref 0.2–1.2)
BUN / CREAT RATIO: 7 (calc) (ref 6–22)
BUN: 6 mg/dL — ABNORMAL LOW (ref 7–25)
CALCIUM: 9.4 mg/dL (ref 8.6–10.3)
CHLORIDE: 105 mmol/L (ref 98–110)
CO2: 26 mmol/L (ref 20–32)
Creat: 0.91 mg/dL (ref 0.70–1.25)
GFR, EST AFRICAN AMERICAN: 104 mL/min/{1.73_m2} (ref >=60)
GFR, EST NON AFRICAN AMERICAN: 90 mL/min/{1.73_m2} (ref >=60)
GLOBULIN: 3.5 g/dL (ref 1.9–3.7)
Glucose, Bld: 102 mg/dL — ABNORMAL HIGH (ref 65–99)
POTASSIUM: 4 mmol/L (ref 3.5–5.3)
SODIUM: 138 mmol/L (ref 135–146)
TOTAL PROTEIN: 7.6 g/dL (ref 6.1–8.1)

## 2017-04-14 LAB — RPR: RPR Ser Ql: NONREACTIVE

## 2017-04-14 LAB — CBC WITH DIFFERENTIAL/PLATELET
BASOS ABS: 30 {cells}/uL (ref 0–200)
Basophils Relative: 0.5 %
EOS PCT: 2.8 %
Eosinophils Absolute: 168 cells/uL (ref 15–500)
HCT: 37.3 % — ABNORMAL LOW (ref 38.5–50.0)
Hemoglobin: 12.1 g/dL — ABNORMAL LOW (ref 13.2–17.1)
Lymphs Abs: 1554 cells/uL (ref 850–3900)
MCH: 26.7 pg — AB (ref 27.0–33.0)
MCHC: 32.4 g/dL (ref 32.0–36.0)
MCV: 82.3 fL (ref 80.0–100.0)
MONOS PCT: 10.6 %
MPV: 10.1 fL (ref 7.5–12.5)
NEUTROS ABS: 3612 {cells}/uL (ref 1500–7800)
NEUTROS PCT: 60.2 %
PLATELETS: 290 10*3/uL (ref 140–400)
RBC: 4.53 10*6/uL (ref 4.20–5.80)
RDW: 22.2 % — ABNORMAL HIGH (ref 11.0–15.0)
Total Lymphocyte: 25.9 %
WBC mixed population: 636 cells/uL (ref 200–950)
WBC: 6 10*3/uL (ref 3.8–10.8)

## 2017-04-14 LAB — T-HELPER CELL (CD4) - (RCID CLINIC ONLY)
CD4 T CELL HELPER: 23 % — AB (ref 33–55)
CD4 T Cell Abs: 420 /uL (ref 400–2700)

## 2017-04-14 LAB — LIPID PANEL
CHOL/HDL RATIO: 3.3 (calc) (ref <5.0)
Cholesterol: 214 mg/dL — ABNORMAL HIGH (ref <200)
HDL: 64 mg/dL (ref >40)
LDL CHOLESTEROL (CALC): 130 mg/dL — AB
NON-HDL CHOLESTEROL (CALC): 150 mg/dL — AB (ref <130)
TRIGLYCERIDES: 94 mg/dL (ref <150)

## 2017-04-15 LAB — HIV-1 RNA QUANT-NO REFLEX-BLD
HIV 1 RNA Quant: 52 copies/mL — ABNORMAL HIGH
HIV-1 RNA Quant, Log: 1.72 Log copies/mL — ABNORMAL HIGH

## 2017-05-06 MED FILL — JULUCA 50-25 MG TAB: 50-25 | 30 days supply | Qty: 30 | Fill #1

## 2017-05-06 MED FILL — PREZCOBIX 800 MG-150 MG TAB: 800-150 | 30 days supply | Qty: 30 | Fill #1

## 2017-05-25 ENCOUNTER — Other Ambulatory Visit: Payer: Self-pay | Admitting: *Deleted

## 2017-05-25 DIAGNOSIS — B009 Herpesviral infection, unspecified: Secondary | ICD-10-CM

## 2017-05-25 MED ORDER — VALACYCLOVIR HCL 1 G PO TABS: 1000.0000 mg | ORAL_TABLET | Freq: Every day | ORAL | 3 refills | Status: AC

## 2017-05-27 ENCOUNTER — Telehealth: Payer: Self-pay

## 2017-05-27 NOTE — Telephone Encounter (Signed)
PA for Valacyclovir HCL 1 gram tab received today faxed to [phone]. Waiting on decision from [facility] pharmacy. Lorenso CourierJose L Maldonado, [place]

## 2017-05-29 MED FILL — PREZCOBIX 800 MG-150 MG TAB: 800-150 | 30 days supply | Qty: 30 | Fill #2

## 2017-05-29 MED FILL — JULUCA 50-25 MG TAB: 50-25 | 30 days supply | Qty: 30 | Fill #2

## 2017-10-13 ENCOUNTER — Ambulatory Visit: Payer: Medicare PPO | Admitting: Infectious Disease
